# Patient Record
Sex: Female | Born: 1975 | Race: White | Hispanic: No | Marital: Married | Smoking: Never smoker
Health system: Southern US, Community
[De-identification: ages and names within clinical notes are randomized; demographics above are authoritative.]

## PROBLEM LIST (undated history)

## (undated) DIAGNOSIS — Z6791 Unspecified blood type, Rh negative: Secondary | ICD-10-CM

## (undated) DIAGNOSIS — K589 Irritable bowel syndrome without diarrhea: Secondary | ICD-10-CM

## (undated) DIAGNOSIS — F419 Anxiety disorder, unspecified: Secondary | ICD-10-CM

## (undated) DIAGNOSIS — B9689 Other specified bacterial agents as the cause of diseases classified elsewhere: Secondary | ICD-10-CM

## (undated) DIAGNOSIS — F329 Major depressive disorder, single episode, unspecified: Secondary | ICD-10-CM

## (undated) DIAGNOSIS — Z87440 Personal history of urinary (tract) infections: Secondary | ICD-10-CM

## (undated) DIAGNOSIS — A491 Streptococcal infection, unspecified site: Secondary | ICD-10-CM

## (undated) DIAGNOSIS — N760 Acute vaginitis: Secondary | ICD-10-CM

## (undated) DIAGNOSIS — F32A Depression, unspecified: Secondary | ICD-10-CM

## (undated) HISTORY — DX: Unspecified blood type, rh negative: Z67.91

## (undated) HISTORY — DX: Anxiety disorder, unspecified: F41.9

## (undated) HISTORY — DX: Streptococcal infection, unspecified site: A49.1

## (undated) HISTORY — DX: Depression, unspecified: F32.A

## (undated) HISTORY — DX: Acute vaginitis: N76.0

## (undated) HISTORY — DX: Acute vaginitis: B96.89

## (undated) HISTORY — DX: Major depressive disorder, single episode, unspecified: F32.9

## (undated) HISTORY — DX: Irritable bowel syndrome, unspecified: K58.9

## (undated) HISTORY — DX: Other specified bacterial agents as the cause of diseases classified elsewhere: B96.89

## (undated) HISTORY — PX: KNEE SURGERY: SHX244

## (undated) HISTORY — DX: Personal history of urinary (tract) infections: Z87.440

---

## 2001-05-17 HISTORY — PX: GANGLION CYST EXCISION: SHX1691

## 2002-09-05 HISTORY — PX: LUMBAR LAMINECTOMY: SHX95

## 2004-09-05 HISTORY — PX: BUNIONECTOMY: SHX129

## 2004-10-06 HISTORY — PX: BREAST LUMPECTOMY: SHX2

## 2005-09-05 HISTORY — PX: FOOT SURGERY: SHX648

## 2005-11-28 ENCOUNTER — Other Ambulatory Visit: Admission: RE | Admit: 2005-11-28 | Discharge: 2005-11-28 | Payer: Self-pay | Admitting: Obstetrics and Gynecology

## 2006-09-09 ENCOUNTER — Inpatient Hospital Stay (HOSPITAL_COMMUNITY): Admission: AD | Admit: 2006-09-09 | Discharge: 2006-09-09 | Payer: Self-pay | Admitting: Obstetrics and Gynecology

## 2006-10-20 ENCOUNTER — Inpatient Hospital Stay (HOSPITAL_COMMUNITY): Admission: AD | Admit: 2006-10-20 | Discharge: 2006-10-23 | Payer: Self-pay | Admitting: Obstetrics and Gynecology

## 2008-11-03 HISTORY — PX: BREAST SURGERY: SHX581

## 2010-11-05 ENCOUNTER — Emergency Department (HOSPITAL_COMMUNITY): Payer: Managed Care, Other (non HMO)

## 2010-11-05 ENCOUNTER — Emergency Department (HOSPITAL_COMMUNITY)
Admission: EM | Admit: 2010-11-05 | Discharge: 2010-11-06 | Disposition: A | Discharge status: Home / Self Care | Payer: Managed Care, Other (non HMO) | Attending: Emergency Medicine | Admitting: Emergency Medicine

## 2010-11-05 DIAGNOSIS — R0789 Other chest pain: Secondary | ICD-10-CM | POA: Insufficient documentation

## 2010-11-05 DIAGNOSIS — R0989 Other specified symptoms and signs involving the circulatory and respiratory systems: Secondary | ICD-10-CM | POA: Insufficient documentation

## 2010-11-05 DIAGNOSIS — I498 Other specified cardiac arrhythmias: Secondary | ICD-10-CM | POA: Insufficient documentation

## 2010-11-05 DIAGNOSIS — R0609 Other forms of dyspnea: Secondary | ICD-10-CM | POA: Insufficient documentation

## 2010-11-05 DIAGNOSIS — K589 Irritable bowel syndrome without diarrhea: Secondary | ICD-10-CM | POA: Insufficient documentation

## 2010-11-05 LAB — BASIC METABOLIC PANEL
Calcium: 9.5 mg/dL (ref 8.4–10.5)
Chloride: 96 mEq/L (ref 96–112)
Creatinine, Ser: 0.91 mg/dL (ref 0.4–1.2)
GFR calc Af Amer: >60 mL/min (ref >60)
Sodium: 135 mEq/L (ref 135–145)

## 2010-11-05 LAB — CBC
MCH: 29.9 pg (ref 26.0–34.0)
Platelets: 259 10*3/uL (ref 150–400)
RBC: 4.41 MIL/uL (ref 3.87–5.11)
WBC: 7.6 10*3/uL (ref 4.0–10.5)

## 2010-11-05 LAB — DIFFERENTIAL
Basophils Absolute: 0 10*3/uL (ref 0.0–0.1)
Basophils Relative: 0 % (ref 0–1)
Eosinophils Absolute: 0.1 10*3/uL (ref 0.0–0.7)
Monocytes Relative: 6 % (ref 3–12)
Neutrophils Relative %: 45 % (ref 43–77)

## 2010-11-05 LAB — POCT CARDIAC MARKERS
CKMB, poc: <1 ng/mL — ABNORMAL LOW (ref 1.0–8.0)
Myoglobin, poc: 36.4 ng/mL (ref 12–200)
Troponin i, poc: <0.05 ng/mL (ref 0.00–0.09)

## 2010-11-06 ENCOUNTER — Encounter (HOSPITAL_COMMUNITY): Payer: Self-pay | Admitting: Radiology

## 2010-11-06 ENCOUNTER — Emergency Department (HOSPITAL_COMMUNITY): Payer: Managed Care, Other (non HMO)

## 2010-11-06 MED ORDER — IOHEXOL 300 MG/ML  SOLN
100.0000 mL | Freq: Once | INTRAMUSCULAR | Status: AC | PRN
  Administered 2010-11-06: 100 mL via INTRAVENOUS

## 2011-10-24 ENCOUNTER — Other Ambulatory Visit: Payer: Self-pay | Admitting: Orthopedic Surgery

## 2011-10-24 DIAGNOSIS — M25552 Pain in left hip: Secondary | ICD-10-CM

## 2011-11-02 ENCOUNTER — Ambulatory Visit
Admission: RE | Admit: 2011-11-02 | Discharge: 2011-11-02 | Disposition: A | Discharge status: Home / Self Care | Payer: Managed Care, Other (non HMO) | Source: Ambulatory Visit | Attending: Orthopedic Surgery | Admitting: Orthopedic Surgery

## 2011-11-02 DIAGNOSIS — M25552 Pain in left hip: Secondary | ICD-10-CM

## 2011-11-02 DIAGNOSIS — M25551 Pain in right hip: Secondary | ICD-10-CM

## 2012-01-25 ENCOUNTER — Telehealth: Payer: Self-pay | Admitting: Obstetrics and Gynecology

## 2012-01-25 NOTE — Telephone Encounter (Signed)
Triage/cht received 

## 2012-01-25 NOTE — Telephone Encounter (Signed)
Spoke with pt c/o d/c and odor pt has appt 01/26/12 at 245 wit EP pt voice understanding

## 2012-01-26 ENCOUNTER — Encounter: Payer: Self-pay | Admitting: Obstetrics and Gynecology

## 2012-03-01 ENCOUNTER — Telehealth: Payer: Self-pay | Admitting: Obstetrics and Gynecology

## 2012-03-01 NOTE — Telephone Encounter (Signed)
medrec/done

## 2012-03-02 ENCOUNTER — Ambulatory Visit (INDEPENDENT_AMBULATORY_CARE_PROVIDER_SITE_OTHER): Payer: Commercial Indemnity | Admitting: Obstetrics and Gynecology

## 2012-03-02 ENCOUNTER — Encounter: Payer: Self-pay | Admitting: Obstetrics and Gynecology

## 2012-03-02 VITALS — BP 118/74 | Temp 98.3°F | Resp 16 | Ht 67.0 in | Wt 159.0 lb

## 2012-03-02 DIAGNOSIS — N949 Unspecified condition associated with female genital organs and menstrual cycle: Secondary | ICD-10-CM

## 2012-03-02 DIAGNOSIS — N898 Other specified noninflammatory disorders of vagina: Secondary | ICD-10-CM

## 2012-03-02 LAB — POCT WET PREP (WET MOUNT)
WBC, Wet Prep HPF POC: NEGATIVE
pH: 5

## 2012-03-02 LAB — POCT OSOM TRICHOMONAS RAPID TEST: Trichomonas vaginalis: NEGATIVE

## 2012-03-02 MED ORDER — METRONIDAZOLE 0.75 % VA GEL: 1.0000 | Freq: Every day | VAGINAL | Status: AC

## 2012-03-02 NOTE — Progress Notes (Signed)
C/O of fishy smell no discharge or irritation Abd soft, nt Normal hair distrubition mons pubis,  EGBUS WNL, sterile speculum exam,  vagina pink, moist normal rugae,  cerix LTC,strings of IUD visible no cervical motion tenderness, No adnexal masses or tenderness Wet prep positive clue, neg hyphae, neg trich A BV GC/CHL pending IUD from 11/2006. Plan removal and reinsertion of IUD f/o. Discussed needs to have back up contraception plans condoms. Lavera Guise, CNM

## 2012-03-03 LAB — GC/CHLAMYDIA PROBE AMP, GENITAL: GC Probe Amp, Genital: NEGATIVE

## 2012-03-06 ENCOUNTER — Encounter: Payer: Self-pay | Admitting: Obstetrics and Gynecology

## 2012-03-06 DIAGNOSIS — Z87440 Personal history of urinary (tract) infections: Secondary | ICD-10-CM

## 2012-03-06 DIAGNOSIS — B9689 Other specified bacterial agents as the cause of diseases classified elsewhere: Secondary | ICD-10-CM | POA: Insufficient documentation

## 2012-03-06 DIAGNOSIS — K589 Irritable bowel syndrome without diarrhea: Secondary | ICD-10-CM | POA: Insufficient documentation

## 2012-03-06 DIAGNOSIS — N760 Acute vaginitis: Secondary | ICD-10-CM

## 2012-03-06 DIAGNOSIS — B379 Candidiasis, unspecified: Secondary | ICD-10-CM

## 2012-03-06 DIAGNOSIS — Z6791 Unspecified blood type, Rh negative: Secondary | ICD-10-CM | POA: Insufficient documentation

## 2012-03-06 DIAGNOSIS — A491 Streptococcal infection, unspecified site: Secondary | ICD-10-CM | POA: Insufficient documentation

## 2012-03-06 DIAGNOSIS — N83209 Unspecified ovarian cyst, unspecified side: Secondary | ICD-10-CM | POA: Insufficient documentation

## 2012-07-12 ENCOUNTER — Encounter: Payer: Self-pay | Admitting: Obstetrics and Gynecology

## 2012-07-12 ENCOUNTER — Ambulatory Visit (INDEPENDENT_AMBULATORY_CARE_PROVIDER_SITE_OTHER): Payer: Commercial Indemnity | Admitting: Obstetrics and Gynecology

## 2012-07-12 VITALS — BP 100/60 | Temp 98.2°F | Wt 164.0 lb

## 2012-07-12 DIAGNOSIS — N390 Urinary tract infection, site not specified: Secondary | ICD-10-CM

## 2012-07-12 DIAGNOSIS — N898 Other specified noninflammatory disorders of vagina: Secondary | ICD-10-CM

## 2012-07-12 LAB — POCT URINALYSIS DIPSTICK
Protein, UA: NEGATIVE
Spec Grav, UA: 1.015
Urobilinogen, UA: NEGATIVE

## 2012-07-12 LAB — POCT WET PREP (WET MOUNT)
Trichomonas Wet Prep HPF POC: NEGATIVE
pH: 4.5

## 2012-07-12 MED ORDER — CEPHALEXIN 500 MG PO CAPS: 500.0000 mg | ORAL_CAPSULE | Freq: Four times a day (QID) | ORAL | Status: DC

## 2012-07-12 MED ORDER — FLUCONAZOLE 150 MG PO TABS: 150.0000 mg | ORAL_TABLET | Freq: Once | ORAL | Status: AC

## 2012-07-12 NOTE — Patient Instructions (Signed)
Warm soaks or compresses  No shaving until rash is resolved

## 2012-07-12 NOTE — Progress Notes (Signed)
36 YO with complaints of pubic "bumps" with the ones that are in panty line being tender.  Symptoms have been present for over a month with some relief from using Bactroban.  Also has over the past week has been having back pain at night and white vaginal discharge that is clumpy.  O: Pelvic: EGBUS- multiple papule/pustular lesions on vulva and crural folds, no adenopathy  Wet Prep: ph-4.5,  whiff-negative, no yeast, clue cells or trichomonas  U/A- SG:-1.15,   ph-6.0,  leukocytes-trace  A: Pubic Folliculitis  Cephalexin 500 mg  #28 qid x 28  no refill  Diflucan 150 mg #1  1 po stat 1 refill  Warm compresses or soaks  RTO-as scheduled or prn.  Zahrah Sutherlin, PA-C

## 2012-08-06 ENCOUNTER — Telehealth: Payer: Self-pay | Admitting: Obstetrics and Gynecology

## 2012-08-06 NOTE — Telephone Encounter (Signed)
Patient with pubic folliculits vs furunculosis managed with warm compresses/soaks and Cephalexin calls to request more antibiotics.  Patient needs to be scheduled to see an M.D. or referral to a dermatologist for management while she continues her soaks/warm compresses.  Should she develop a temperature > 100.4 degrees she should go to the ER or Urgent Care.  Travon Crochet, PA-C

## 2012-08-06 NOTE — Telephone Encounter (Signed)
EP, please see pt's message.

## 2012-08-07 NOTE — Telephone Encounter (Signed)
Tc to pt regarding pt msg.  Informed pt of EP's comments below.  Pt states would like to see a MD in the office for eval.  Pt scheduled an appt w/  [facility] tomorrow 08/08/12 @ 1600 for eval.

## 2012-08-08 ENCOUNTER — Encounter: Payer: Self-pay | Admitting: Obstetrics and Gynecology

## 2012-08-08 ENCOUNTER — Ambulatory Visit (INDEPENDENT_AMBULATORY_CARE_PROVIDER_SITE_OTHER): Payer: Commercial Indemnity | Admitting: Obstetrics and Gynecology

## 2012-08-08 VITALS — BP 100/58 | Ht 67.0 in | Wt 168.0 lb

## 2012-08-08 DIAGNOSIS — L738 Other specified follicular disorders: Secondary | ICD-10-CM

## 2012-08-08 DIAGNOSIS — L739 Follicular disorder, unspecified: Secondary | ICD-10-CM

## 2012-08-08 NOTE — Progress Notes (Signed)
GYN PROBLEM VISIT  Subjective: Ms. Allison Nelson is a 36 y.o. year old female,G2P2002, who presents for a problem visit. She has been treated for folliculitis with Keflex which she completed.  She did however, scratch the tops off some of the lesions on her buttocks, not because they were itching, but because she thought it would help her heal.  And bleed for about 15-20 mins.    Objective:  BP 100/58  Ht 5\' 7"  (1.702 m)  Wt 168 lb (76.204 kg)  BMI 26.31 kg/m2   1mm papules in various states of healing, Those on the right buttock have been unroofed and are erythematous    Assessment: Partially treated folliculitis with some healing  Plan: Culture  Warm soaks.  Dry with blow dryer Return to office prn if symptoms worsen or fail to improve.   Dierdre Forth, MD  08/08/2012 6:34 PM

## 2012-08-30 ENCOUNTER — Telehealth: Payer: Self-pay | Admitting: Obstetrics and Gynecology

## 2012-08-30 NOTE — Telephone Encounter (Signed)
Tc to pt per telephone call. Pt req ATB's.  Informed pt needs appt for eval. Appt sched 08/31/12 @ 3:15p with EP for eval. Pt agrees.

## 2012-08-30 NOTE — Telephone Encounter (Signed)
vph pt 

## 2012-08-31 ENCOUNTER — Ambulatory Visit (INDEPENDENT_AMBULATORY_CARE_PROVIDER_SITE_OTHER): Payer: Commercial Indemnity | Admitting: Obstetrics and Gynecology

## 2012-08-31 ENCOUNTER — Encounter: Payer: Self-pay | Admitting: Obstetrics and Gynecology

## 2012-08-31 VITALS — BP 106/62 | Ht 67.0 in | Wt 170.0 lb

## 2012-08-31 DIAGNOSIS — R21 Rash and other nonspecific skin eruption: Secondary | ICD-10-CM

## 2012-08-31 DIAGNOSIS — Z30432 Encounter for removal of intrauterine contraceptive device: Secondary | ICD-10-CM

## 2012-08-31 MED ORDER — BETAMETHASONE DIPROPIONATE AUG 0.05 % EX CREA: TOPICAL_CREAM | Freq: Two times a day (BID) | CUTANEOUS | Status: DC

## 2012-08-31 MED ORDER — AMOXICILLIN-POT CLAVULANATE 875-125 MG PO TABS: 1.0000 | ORAL_TABLET | Freq: Two times a day (BID) | ORAL | Status: DC

## 2012-08-31 MED ORDER — FLUCONAZOLE 150 MG PO TABS: 150.0000 mg | ORAL_TABLET | Freq: Once | ORAL | Status: AC

## 2012-08-31 NOTE — Progress Notes (Signed)
36 YO complains of folliculitis in the genital area (flare).  Has been doing sitz baths and antibiotics two months ago but symptoms have not really gone away.  States that areas bleed when she scratches those areas.  Lastly wants to try to have another baby so would like to have her IUD removed.    O: Pelvic:  EGBUS- diffuse unroofed ? pin-point sized, raised lesions, mildly tender with no inguinal adenopathy; vagina-copious mucoid white discharge, cervix      IUD removed without difficulty per protocol, uterus/adnexae-normal   A:  IUD Removal      Perineal Rash      Desire to conceive  P: HSV culture pending       Warm soaks        Diprolene AF  0.05%  15 grams apply to affected areas bid x 7-14 days only no refills        Patient would like to try Augmentin as it was the antibiotic that resolved her husband's symptoms       of the same thing        Augmentin 875  #14 bid x 7 days        Diflucan 150 mg #1 1 po stat 1 refill                    RTO-as scheduled or prn       Refer to Dermatologist  Tarra Pence, PA-C

## 2012-09-03 LAB — HERPES SIMPLEX VIRUS CULTURE: Organism ID, Bacteria: NOT DETECTED

## 2012-09-19 ENCOUNTER — Encounter: Payer: Commercial Indemnity | Admitting: Obstetrics and Gynecology

## 2012-10-08 ENCOUNTER — Ambulatory Visit: Payer: Commercial Indemnity | Admitting: Obstetrics and Gynecology

## 2012-10-08 ENCOUNTER — Encounter: Payer: Self-pay | Admitting: Obstetrics and Gynecology

## 2012-10-08 VITALS — BP 112/72 | Resp 14 | Ht 67.0 in | Wt 166.0 lb

## 2012-10-08 DIAGNOSIS — Z124 Encounter for screening for malignant neoplasm of cervix: Secondary | ICD-10-CM

## 2012-10-08 DIAGNOSIS — Z01419 Encounter for gynecological examination (general) (routine) without abnormal findings: Secondary | ICD-10-CM

## 2012-10-08 NOTE — Progress Notes (Signed)
Patient ID: Allison Nelson, female   DOB: 1975-12-04, 37 y.o.   MRN: [id number] Contraception None. Trying to conceive Last pap 03/2011 wnl Last Mammo 09/2012 Last Colonoscopy 10-12 years ago  Last Dexa Scan never Primary MD Dr. [place], [place] Abuse at Home none  Just recently started trying to conceive.  Filed Vitals:   10/08/12 1002  BP: 112/72  Resp: 14   ROS: noncontributory  Physical Examination: General appearance - alert, well appearing, and in no distress Neck - supple, no significant adenopathy Chest - clear to auscultation, no wheezes, rales or rhonchi, symmetric air entry Heart - normal rate and regular rhythm Abdomen - soft, nontender, nondistended, no masses or organomegaly Breasts - breasts appear normal, no suspicious masses, no skin or nipple changes or axillary nodes Pelvic - normal external genitalia, vulva, vagina, cervix, uterus and adnexa Back exam - no CVAT Extremities - no edema, redness or tenderness in the calves or thighs  A/P Pap today Refer to [facility] if no success with conception after , pt will call [facility] in 12yr

## 2012-10-09 LAB — PAP IG W/ RFLX HPV ASCU

## 2013-01-30 LAB — OB RESULTS CONSOLE RUBELLA ANTIBODY, IGM: Rubella: IMMUNE

## 2013-01-30 LAB — OB RESULTS CONSOLE GC/CHLAMYDIA
CHLAMYDIA, DNA PROBE: NEGATIVE
GC PROBE AMP, GENITAL: NEGATIVE

## 2013-01-30 LAB — OB RESULTS CONSOLE HIV ANTIBODY (ROUTINE TESTING): HIV: NONREACTIVE

## 2013-01-30 LAB — OB RESULTS CONSOLE HEPATITIS B SURFACE ANTIGEN: Hepatitis B Surface Ag: NEGATIVE

## 2013-02-05 LAB — OB RESULTS CONSOLE ABO/RH: RH TYPE: NEGATIVE

## 2013-05-19 ENCOUNTER — Emergency Department (HOSPITAL_COMMUNITY)
Admission: EM | Admit: 2013-05-19 | Discharge: 2013-05-19 | Disposition: A | Discharge status: Home / Self Care | Payer: Managed Care, Other (non HMO) | Attending: Emergency Medicine | Admitting: Emergency Medicine

## 2013-05-19 ENCOUNTER — Encounter (HOSPITAL_COMMUNITY): Payer: Self-pay | Admitting: Emergency Medicine

## 2013-05-19 DIAGNOSIS — O99340 Other mental disorders complicating pregnancy, unspecified trimester: Secondary | ICD-10-CM | POA: Insufficient documentation

## 2013-05-19 DIAGNOSIS — R51 Headache: Secondary | ICD-10-CM | POA: Insufficient documentation

## 2013-05-19 DIAGNOSIS — F329 Major depressive disorder, single episode, unspecified: Secondary | ICD-10-CM | POA: Insufficient documentation

## 2013-05-19 DIAGNOSIS — F3289 Other specified depressive episodes: Secondary | ICD-10-CM | POA: Insufficient documentation

## 2013-05-19 DIAGNOSIS — Z79899 Other long term (current) drug therapy: Secondary | ICD-10-CM | POA: Insufficient documentation

## 2013-05-19 DIAGNOSIS — O9989 Other specified diseases and conditions complicating pregnancy, childbirth and the puerperium: Secondary | ICD-10-CM | POA: Insufficient documentation

## 2013-05-19 DIAGNOSIS — Z8619 Personal history of other infectious and parasitic diseases: Secondary | ICD-10-CM | POA: Insufficient documentation

## 2013-05-19 DIAGNOSIS — F411 Generalized anxiety disorder: Secondary | ICD-10-CM | POA: Insufficient documentation

## 2013-05-19 DIAGNOSIS — Z87440 Personal history of urinary (tract) infections: Secondary | ICD-10-CM | POA: Insufficient documentation

## 2013-05-19 DIAGNOSIS — K589 Irritable bowel syndrome without diarrhea: Secondary | ICD-10-CM | POA: Insufficient documentation

## 2013-05-19 DIAGNOSIS — K429 Umbilical hernia without obstruction or gangrene: Secondary | ICD-10-CM

## 2013-05-19 NOTE — ED Notes (Signed)
C/o umbilical hernia that she has had for years "turning blue" and increased pain since Thursday.  Denies pain at hernia at this time.  Also c/o headache since Wednesday.  Pt is [redacted] weeks pregnant and reports frequent headaches with this pregnancy.

## 2013-05-19 NOTE — ED Provider Notes (Signed)
CSN: [id number]     Arrival date & time 05/19/13  1840 History   First MD Initiated Contact with Patient 05/19/13 1901     Chief Complaint  Patient presents with  . Hernia  . Headache   (Consider location/radiation/quality/duration/timing/severity/associated sxs/prior Treatment) Patient is a 37 y.o. female presenting with abdominal pain. The history is provided by the patient. No language interpreter was used.  Abdominal Pain Pain location: umbilical. Pain quality: aching and cramping   Pain radiates to:  Does not radiate Pain severity:  Mild Onset quality:  Gradual Duration:  3 days Timing:  Intermittent Progression:  Waxing and waning Chronicity:  New Context: not previous surgeries and not trauma   Context comment:  Umbilical hernia Relieved by:  Nothing Worsened by:  Nothing tried Ineffective treatments:  None tried Associated symptoms: no chest pain, no cough, no diarrhea, no dysuria, no fever, no hematuria, no nausea, no shortness of breath, no sore throat and no vomiting   Risk factors: pregnancy   Risk factors: no recent hospitalization     Past Medical History  Diagnosis Date  . IBS (irritable bowel syndrome)   . Anxiety   . Depression   . BV (bacterial vaginosis)   . Blood type, Rh negative   . GBS (group B streptococcus) infection   . Hx: UTI (urinary tract infection)    Past Surgical History  Procedure Laterality Date  . Breast surgery  11/2008    Breast Implants  . Bunionectomy  09/2004    right bunion  . Lumbar laminectomy  09/2002  . Ganglion cyst excision  05/17/2001    Right wrist  . Knee surgery      Torn meniscus  . Breast lumpectomy  10/2004  . Foot surgery  09/2005    remove pins from previous surgery   Family History  Problem Relation Age of Onset  . Cancer Paternal Grandfather     LUNG  . Heart disease Paternal Grandmother   . Hyperlipidemia Father    History  Substance Use Topics  . Smoking status: Never Smoker   . Smokeless tobacco:  Never Used  . Alcohol Use: Yes     Comment: Wine & beer on weekends   OB History   Grav Para Term Preterm Abortions TAB SAB Ect Mult Living   3 2 2       2      Review of Systems  Constitutional: Negative for fever.  HENT: Negative for congestion, sore throat and rhinorrhea.   Respiratory: Negative for cough and shortness of breath.   Cardiovascular: Negative for chest pain.  Gastrointestinal: Negative for nausea, vomiting, abdominal pain and diarrhea.  Genitourinary: Negative for dysuria and hematuria.  Skin: Negative for rash.  Neurological: Negative for syncope, light-headedness and headaches.  All other systems reviewed and are negative.    Allergies  Review of patient's allergies indicates no known allergies.  Home Medications   Current Outpatient Rx  Name  Route  Sig  Dispense  Refill  . amoxicillin-clavulanate (AUGMENTIN) 875-125 MG per tablet   Oral   Take 1 tablet by mouth 2 (two) times daily.   14 tablet   0   . augmented betamethasone dipropionate (DIPROLENE AF) 0.05 % cream   Topical   Apply topically 2 (two) times daily. x 7-14 days   15 g   0   . cephALEXin (KEFLEX) 500 MG capsule   Oral   Take 1 capsule (500 mg total) by mouth 4 (four) times daily.  28 capsule   0   . cetirizine (ZYRTEC) 10 MG tablet   Oral   Take 10 mg by mouth daily.         Marland Kitchen docusate sodium (COLACE) 100 MG capsule   Oral   Take 100 mg by mouth 2 (two) times daily.         Marland Kitchen escitalopram (LEXAPRO) 20 MG tablet   Oral   Take 20 mg by mouth daily.         Marland Kitchen gabapentin (NEURONTIN) 300 MG capsule   Oral   Take 300 mg by mouth 3 (three) times daily.         Marland Kitchen glucosamine-chondroitin 500-400 MG tablet   Oral   Take 1 tablet by mouth once.         Marland Kitchen HYDROcodone-acetaminophen (NORCO) 5-325 MG per tablet   Oral   Take 1 tablet by mouth every 6 (six) hours as needed.         . Lactobacillus-Inulin (CULTURELLE) CAPS   Oral   Take by mouth 1 day or 1 dose.          . lubiprostone (AMITIZA) 24 MCG capsule   Oral   Take 24 mcg by mouth 2 (two) times daily with a meal.         . omeprazole (PRILOSEC) 40 MG capsule   Oral   Take 40 mg by mouth daily.         Marland Kitchen senna (SENOKOT) 8.6 MG TABS   Oral   Take 1 tablet by mouth.         . zolmitriptan (ZOMIG) 5 MG tablet   Oral   Take 5 mg by mouth as needed.          BP 144/83  Pulse 85  Temp(Src) 98.3 F (36.8 C) (Oral)  Resp 16  SpO2 100%  LMP 09/13/2012 Physical Exam  Nursing note and vitals reviewed. Constitutional: She is oriented to person, place, and time. She appears well-developed and well-nourished.  HENT:  Head: Normocephalic and atraumatic.  Right Ear: External ear normal.  Left Ear: External ear normal.  Eyes: EOM are normal.  Neck: Normal range of motion. Neck supple.  Cardiovascular: Normal rate, regular rhythm and intact distal pulses.  Exam reveals no gallop and no friction rub.   No murmur heard. Pulmonary/Chest: Effort normal and breath sounds normal. No respiratory distress. She has no wheezes. She has no rales. She exhibits no tenderness.  Abdominal: Soft. Bowel sounds are normal. She exhibits no distension. There is no tenderness. There is no rebound.  Small reducible periumbilical hernia  Musculoskeletal: Normal range of motion. She exhibits no edema and no tenderness.  Lymphadenopathy:    She has no cervical adenopathy.  Neurological: She is alert and oriented to person, place, and time.  Skin: Skin is warm. No rash noted.  Psychiatric: She has a normal mood and affect. Her behavior is normal.    ED Course  Procedures (including critical care time) Labs Review Labs Reviewed - No data to display Imaging Review No results found.  MDM   1. Umbilical hernia   2. Headache    7:05 PM Pt is a 37 y.o. female with pertinent PMHX of G3P2002 who presents to the ED with periumbilical hernia that turned blue with occasional abdominal pain and Headache.  Pt has history of chronic trigger point headaches that she receives injections for. Pt stated her headache is similar to previous, no thunderclap. No blurred or double  vision. Pt took a vicodin with some relief and does not wish to have anything else for it. Pt comes in for concern of her belly button turning blue. Pt noted an umbilical hernia that is worsening. Pt occasionally get's pain. Pt felt something "crunchy" the other day but was able to push it back in. No fevers, no nausea, vomiting or diarrhea. No syncope. Pt stated she is [redacted] weeks pregnant. Denies vaginal bleeding or discharge. Deneis dysuria.  Study:  Limited ultrasound of the abdomen/umbilicus  Indications:  Possible umbilical hernia  Performed and interpreted by myself at the bedside.  Images are not archived. Printed images  Findings include: umbilical hernia  Study limited by:NA  Interpretation:  No evidence of incarceration  Comments: small umbilical hernia without evidence of incarceration of bowel or omentum  CPT:  Abdominal wall [phone]  A brief and limited bedside US was performed to determine the FHR as nursing was unable to obtain it. The FHR is 150 bpm. No other gross abnormalities are noted. The study was performed and interpreted by me and images are archived per our departmental policy.  No incarceration, reducible. Plan for discharge. Reassuring fetal heart rate. Fetus noted to move during US exam. Pt given precauitons for inability to reduce.Pt to follow up as needed with OB. Pt stated she has plenty of medication at home for her headache and declines treatment for her headache at this time.  8:04 PM: I have discussed the diagnosis/risks/treatment options with the patient and believe the pt to be eligible for discharge home to follow-up with OBGYN. We also discussed returning to the ED immediately if new or worsening sx occur. We discussed the sx which are most concerning (e.g., unable to reduce hernia) that  necessitate immediate return. Any new prescriptions provided to the patient are listed below.   New Prescriptions   No medications on file    The patient appears reasonably screened and/or stabilized for discharge and I doubt any other medical condition or other [facility] requiring further screening, evaluation or treatment in the ED at this time prior to discharge . Pt in agreement with discharge plan. Return precautions given. Pt discharged VSS  Pt was discussed with my attending, Dr. Marlene Bast, MD 05/19/13 [phone]

## 2013-05-29 NOTE — ED Provider Notes (Deleted)
Medical screening examination/treatment/procedure(s) were performed by non-physician practitioner and as supervising physician I was immediately available for consultation/collaboration.   Roney Marion, MD 05/29/13 1059

## 2013-05-30 NOTE — ED Provider Notes (Signed)
I saw and evaluated the patient, reviewed the resident's note and I agree with the findings and plan  Pt abdominal exam without peritoneal irritation.  Pt states minimal discomfort at this time. Small reducible periumbilical hernia without cyanosis or pain/tenderness.  Bedside [place] performed by resident reviewed.  Obvious viable intrauterine pregnancy.   Roney Marion, MD 05/30/13 1105

## 2013-06-26 DIAGNOSIS — Z8742 Personal history of other diseases of the female genital tract: Secondary | ICD-10-CM | POA: Insufficient documentation

## 2013-06-26 DIAGNOSIS — F3289 Other specified depressive episodes: Secondary | ICD-10-CM | POA: Insufficient documentation

## 2013-06-26 DIAGNOSIS — R51 Headache: Secondary | ICD-10-CM | POA: Insufficient documentation

## 2013-06-26 DIAGNOSIS — Z79899 Other long term (current) drug therapy: Secondary | ICD-10-CM | POA: Insufficient documentation

## 2013-06-26 DIAGNOSIS — F411 Generalized anxiety disorder: Secondary | ICD-10-CM | POA: Insufficient documentation

## 2013-06-26 DIAGNOSIS — Z8619 Personal history of other infectious and parasitic diseases: Secondary | ICD-10-CM | POA: Insufficient documentation

## 2013-06-26 DIAGNOSIS — O9989 Other specified diseases and conditions complicating pregnancy, childbirth and the puerperium: Secondary | ICD-10-CM | POA: Insufficient documentation

## 2013-06-26 DIAGNOSIS — Z87440 Personal history of urinary (tract) infections: Secondary | ICD-10-CM | POA: Insufficient documentation

## 2013-06-26 DIAGNOSIS — Z8719 Personal history of other diseases of the digestive system: Secondary | ICD-10-CM | POA: Insufficient documentation

## 2013-06-26 DIAGNOSIS — F329 Major depressive disorder, single episode, unspecified: Secondary | ICD-10-CM | POA: Insufficient documentation

## 2013-06-27 ENCOUNTER — Emergency Department (HOSPITAL_COMMUNITY)
Admission: EM | Admit: 2013-06-27 | Discharge: 2013-06-27 | Disposition: A | Discharge status: Home / Self Care | Payer: Managed Care, Other (non HMO) | Attending: Emergency Medicine | Admitting: Emergency Medicine

## 2013-06-27 ENCOUNTER — Emergency Department (HOSPITAL_COMMUNITY): Payer: Managed Care, Other (non HMO)

## 2013-06-27 ENCOUNTER — Encounter (HOSPITAL_COMMUNITY): Payer: Self-pay | Admitting: Emergency Medicine

## 2013-06-27 DIAGNOSIS — Z3490 Encounter for supervision of normal pregnancy, unspecified, unspecified trimester: Secondary | ICD-10-CM

## 2013-06-27 LAB — CSF CELL COUNT WITH DIFFERENTIAL
RBC Count, CSF: 17 /mm3 — ABNORMAL HIGH
Tube #: 1
WBC, CSF: 0 /mm3 (ref 0–5)
WBC, CSF: 0 /mm3 (ref 0–5)

## 2013-06-27 LAB — GRAM STAIN

## 2013-06-27 LAB — PROTEIN, CSF: Total  Protein, CSF: 16 mg/dL (ref 15–45)

## 2013-06-27 MED ORDER — HYDROMORPHONE HCL PF 1 MG/ML IJ SOLN
1.0000 mg | Freq: Once | INTRAMUSCULAR | Status: AC
  Administered 2013-06-27: 1 mg via INTRAVENOUS
  Filled 2013-06-27: qty 1

## 2013-06-27 MED ORDER — METOCLOPRAMIDE HCL 5 MG/ML IJ SOLN
10.0000 mg | Freq: Once | INTRAMUSCULAR | Status: AC
  Administered 2013-06-27: 10 mg via INTRAVENOUS
  Filled 2013-06-27: qty 2

## 2013-06-27 MED ORDER — HYDROMORPHONE HCL 2 MG PO TABS: 2.0000 mg | ORAL_TABLET | ORAL | Status: DC | PRN

## 2013-06-27 MED ORDER — DEXAMETHASONE SODIUM PHOSPHATE 10 MG/ML IJ SOLN
10.0000 mg | Freq: Once | INTRAMUSCULAR | Status: AC
  Administered 2013-06-27: 10 mg via INTRAVENOUS
  Filled 2013-06-27: qty 1

## 2013-06-27 MED ORDER — SODIUM CHLORIDE 0.9 % IV SOLN
1000.0000 mL | Freq: Once | INTRAVENOUS | Status: AC
  Administered 2013-06-27: 1000 mL via INTRAVENOUS

## 2013-06-27 MED ORDER — DIPHENHYDRAMINE HCL 50 MG/ML IJ SOLN
25.0000 mg | Freq: Once | INTRAMUSCULAR | Status: AC
Start: 2013-06-27 — End: 2013-06-27
  Administered 2013-06-27: 25 mg via INTRAVENOUS
  Filled 2013-06-27: qty 1

## 2013-06-27 MED ORDER — SODIUM CHLORIDE 0.9 % IV SOLN
1000.0000 mL | INTRAVENOUS | Status: DC
  Administered 2013-06-27: 1000 mL via INTRAVENOUS

## 2013-06-27 NOTE — ED Notes (Signed)
Pt states the pain is getting worse.

## 2013-06-27 NOTE — ED Notes (Signed)
Presents with headache that began this am, began with neck pain and has progressed to migraine associated with light and sound sensitivity, slight nausea. pain described as pounding. Worse behind eye. Pt is [redacted] weeks pregnant, FHT 143, bp 134/76.  Alert and oriented. Answers all questions appropriately.

## 2013-06-27 NOTE — ED Provider Notes (Signed)
CSN: [id number]     Arrival date & time 06/26/13  2359 History   First MD Initiated Contact with Patient 06/27/13 [id number]     Chief Complaint  Patient presents with  . Headache   (Consider location/radiation/quality/duration/timing/severity/associated sxs/prior Treatment) Patient is a 37 y.o. female presenting with headaches. The history is provided by the patient.  Headache She is [redacted] weeks pregnant and comes in with headache for the last 4 days. Headache is starting in the superior part of the neck and radiates to the top of head or head into the frontal area. She describes it as a throbbing sensation. It got much worse this afternoon. She had been taking hydrocodone 10 mg which had been keeping the headache under control until today. There is mild photophobia and phonophobia. She denies nausea vomiting and she denies visual change. She denies weakness, numbness, tingling. Denies fever or chills. There has been normal fetal movement. Headache is similar to migraine headaches she has had in the past. There have been no complications from her pregnancy other than narcotic use further pinched nerve in her neck.  Past Medical History  Diagnosis Date  . IBS (irritable bowel syndrome)   . Anxiety   . Depression   . BV (bacterial vaginosis)   . Blood type, Rh negative   . GBS (group B streptococcus) infection   . Hx: UTI (urinary tract infection)    Past Surgical History  Procedure Laterality Date  . Breast surgery  11/2008    Breast Implants  . Bunionectomy  09/2004    right bunion  . Lumbar laminectomy  09/2002  . Ganglion cyst excision  05/17/2001    Right wrist  . Knee surgery      Torn meniscus  . Breast lumpectomy  10/2004  . Foot surgery  09/2005    remove pins from previous surgery   Family History  Problem Relation Age of Onset  . Cancer Paternal Grandfather     LUNG  . Heart disease Paternal Grandmother   . Hyperlipidemia Father    History  Substance Use Topics  . Smoking  status: Never Smoker   . Smokeless tobacco: Never Used  . Alcohol Use: Yes     Comment: Wine & beer on weekends   OB History   Grav Para Term Preterm Abortions TAB SAB Ect Mult Living   3 2 2       2      Review of Systems  Neurological: Positive for headaches.  All other systems reviewed and are negative.    Allergies  Review of patient's allergies indicates no known allergies.  Home Medications   Current Outpatient Rx  Name  Route  Sig  Dispense  Refill  . cetirizine (ZYRTEC) 10 MG tablet   Oral   Take 10 mg by mouth daily.         Marland Kitchen docusate sodium (COLACE) 100 MG capsule   Oral   Take 100 mg by mouth 2 (two) times daily.         Marland Kitchen escitalopram (LEXAPRO) 20 MG tablet   Oral   Take 30 mg by mouth daily.          Marland Kitchen HYDROcodone-acetaminophen (NORCO) 10-325 MG per tablet   Oral   Take 1 tablet by mouth every 6 (six) hours as needed for pain (pain).         . Lactobacillus-Inulin (CULTURELLE) CAPS   Oral   Take by mouth 1 day or 1 dose.         Marland Kitchen  prenatal vitamin w/FE, FA (PRENATAL 1 + 1) 27-1 MG TABS tablet   Oral   Take 1 tablet by mouth daily at 12 noon.         . senna (SENOKOT) 8.6 MG TABS   Oral   Take 1 tablet by mouth.          BP 134/76  Pulse 86  Temp(Src) 98.8 F (37.1 C) (Oral)  Resp 16  SpO2 96%  LMP 09/13/2012 Physical Exam  Nursing note and vitals reviewed.  37 year old female, resting comfortably and in no acute distress. Vital signs are normal. Oxygen saturation is 96%, which is normal. Head is normocephalic and atraumatic. PERRLA, EOMI. Oropharynx is clear. Fundi show no hemorrhage, exudate, or papilledema. Neck is nontender and supple without adenopathy or JVD. There is mild tenderness to palpation at the insertion of the right paracervical muscles. Back is nontender and there is no CVA tenderness. Lungs are clear without rales, wheezes, or rhonchi. Chest is nontender. Heart has regular rate and rhythm without  murmur. Abdomen is soft, flat, nontender without masses or hepatosplenomegaly and peristalsis is normoactive. Gravid uterus is present and nontender. Size is consistent with her dates. Extremities have no cyanosis or edema, full range of motion is present. Skin is warm and dry without rash. Neurologic: Mental status is normal, cranial nerves are intact, there are no motor or sensory deficits.  ED Course  LUMBAR PUNCTURE Date/Time: 06/27/2013 4:30 AM Performed by: Dione Booze Authorized by: Preston Fleeting, Leveda Kendrix Consent: Verbal consent obtained. written consent obtained. Risks and benefits: risks, benefits and alternatives were discussed Consent given by: patient Patient understanding: patient states understanding of the procedure being performed Patient consent: the patient's understanding of the procedure matches consent given Procedure consent: procedure consent matches procedure scheduled Relevant documents: relevant documents present and verified Site marked: the operative site was marked Imaging studies: imaging studies available Required items: required blood products, implants, devices, and special equipment available Patient identity confirmed: verbally with patient and arm band Time out: Immediately prior to procedure a "time out" was called to verify the correct patient, procedure, equipment, support staff and site/side marked as required. Indications: evaluation for subarachnoid hemorrhage Anesthesia: local infiltration Local anesthetic: lidocaine 1% without epinephrine Anesthetic total: 1 ml Patient sedated: no Preparation: Patient was prepped and draped in the usual sterile fashion. Lumbar space: L3-L4 interspace Patient's position: left lateral decubitus Needle gauge: 22 Needle type: spinal needle - Quincke tip Needle length: 3.5 in Number of attempts: 1 Opening pressure: 18 cm H2O Fluid appearance: clear Tubes of fluid: 4 Total volume: 4 ml Post-procedure: site cleaned  and adhesive bandage applied Patient tolerance: Patient tolerated the procedure well with no immediate complications.   (including critical care time) Labs Review Results for orders placed during the hospital encounter of 06/27/13  GRAM STAIN      Result Value Range   Specimen Description CSF     Special Requests NONE     Gram Stain       Value: CYTOSPIN     NO WBC SEEN     NO ORGANISMS SEEN   Report Status 06/27/2013 FINAL    CSF CELL COUNT WITH DIFFERENTIAL      Result Value Range   Tube # 1     Color, CSF COLORLESS  COLORLESS   Appearance, CSF CLEAR (*) CLEAR   Supernatant NOT INDICATED     RBC Count, CSF 17 (*) 0 /cu mm   WBC, CSF 0  0 -  5 /cu mm   Segmented Neutrophils-CSF PENDING  0 - 6 %   Lymphs, CSF PENDING  40 - 80 %   Monocyte-Macrophage-Spinal Fluid PENDING  15 - 45 %   Eosinophils, CSF PENDING  0 - 1 %   Other Cells, CSF PENDING    CSF CELL COUNT WITH DIFFERENTIAL      Result Value Range   Tube # 4     Color, CSF COLORLESS  COLORLESS   Appearance, CSF CLEAR (*) CLEAR   Supernatant NOT INDICATED     RBC Count, CSF 0  0 /cu mm   WBC, CSF 0  0 - 5 /cu mm   Segmented Neutrophils-CSF PENDING  0 - 6 %   Lymphs, CSF PENDING  40 - 80 %   Monocyte-Macrophage-Spinal Fluid PENDING  15 - 45 %   Eosinophils, CSF PENDING  0 - 1 %   Other Cells, CSF PENDING    GLUCOSE, CSF      Result Value Range   Glucose, CSF 62  43 - 76 mg/dL  PROTEIN, CSF      Result Value Range   Total  Protein, CSF 16  15 - 45 mg/dL   Ct Head Wo Contrast  06/27/2013   CLINICAL DATA:  Headache. Onset on Monday but more severe this evening. Headache is worse with light and sound. Patient is pregnant and was shielded for the examination.  EXAM: CT HEAD WITHOUT CONTRAST  TECHNIQUE: Contiguous axial images were obtained from the base of the skull through the vertex without intravenous contrast.  COMPARISON:  None.  FINDINGS: Ventricles and sulci appear symmetrical. No mass effect or midline shift. No  abnormal extra-axial fluid collections. Gray-white matter junctions are distinct. Basal cisterns are not effaced. No evidence of acute intracranial hemorrhage. No depressed skull fractures. Visualized paranasal sinuses and mastoid air cells are not opacified.  IMPRESSION: No acute intracranial abnormalities.   Electronically Signed   By: Burman Nieves M.D.   On: 06/27/2013 02:22    Imaging Review Ct Head Wo Contrast  06/27/2013   CLINICAL DATA:  Headache. Onset on Monday but more severe this evening. Headache is worse with light and sound. Patient is pregnant and was shielded for the examination.  EXAM: CT HEAD WITHOUT CONTRAST  TECHNIQUE: Contiguous axial images were obtained from the base of the skull through the vertex without intravenous contrast.  COMPARISON:  None.  FINDINGS: Ventricles and sulci appear symmetrical. No mass effect or midline shift. No abnormal extra-axial fluid collections. Gray-white matter junctions are distinct. Basal cisterns are not effaced. No evidence of acute intracranial hemorrhage. No depressed skull fractures. Visualized paranasal sinuses and mastoid air cells are not opacified.  IMPRESSION: No acute intracranial abnormalities.   Electronically Signed   By: Burman Nieves M.D.   On: 06/27/2013 02:22   MDM   1. Headache   2. Pregnancy    Headache which may be a migraine variant. Third trimester pregnancy. She'll be given IV fluids, IV metoclopramide, IV diphenhydramine and reassessed. Old records are reviewed and I do not see prior ED visits for migraine headaches.  Patient did not get any relief with the above-noted treatment. She was sent for CT of the head which has come back negative. She is given hydromorphone for her pain which gave her moderate relief. He patient was advised of need for lumbar puncture to rule out subarachnoid hemorrhage. Patient expressed understanding and lumbar puncture was done with normal results. She is given a dose  of dexamethasone  in the ED. She is discharged with a prescription for hydromorphone. I have explained to her the risk of fetal addiction. She has a neurologist who she sees for her back and she is to ask him if he can take care of her for her headaches. I am hoping to minimize the amount of narcotics she has to take. Patient expresses understanding.  Dione Booze, MD 06/27/13 [phone]

## 2013-06-27 NOTE — ED Notes (Signed)
Pt to ED for evaluation of a headache onset on Monday that has been more severe this evening.  Pt has been taking Hydrocodone 10mg  since Monday which has been working for the pain but tonight when she woke up from a nap the "pain was unbearable".  Admits to headache being worse with light and sound- denies N/V.  OB rapid response nurse at bedside.

## 2013-06-30 LAB — CSF CULTURE W GRAM STAIN
Culture: NO GROWTH
Gram Stain: NONE SEEN

## 2013-06-30 LAB — CSF CULTURE

## 2013-08-14 ENCOUNTER — Other Ambulatory Visit: Payer: Self-pay | Admitting: Specialist

## 2013-08-14 DIAGNOSIS — M542 Cervicalgia: Secondary | ICD-10-CM

## 2013-08-14 DIAGNOSIS — M5416 Radiculopathy, lumbar region: Secondary | ICD-10-CM

## 2013-08-15 ENCOUNTER — Other Ambulatory Visit: Payer: Managed Care, Other (non HMO)

## 2013-08-22 ENCOUNTER — Ambulatory Visit
Admission: RE | Admit: 2013-08-22 | Discharge: 2013-08-22 | Disposition: A | Discharge status: Home / Self Care | Payer: Managed Care, Other (non HMO) | Source: Ambulatory Visit | Attending: Specialist | Admitting: Specialist

## 2013-08-22 DIAGNOSIS — M5416 Radiculopathy, lumbar region: Secondary | ICD-10-CM

## 2013-08-22 DIAGNOSIS — M542 Cervicalgia: Secondary | ICD-10-CM

## 2013-09-04 LAB — OB RESULTS CONSOLE GBS: GBS: NEGATIVE

## 2013-09-05 NOTE — L&D Delivery Note (Signed)
Delivery Note At 1:08 PM a viable female was delivered via Vaginal, Spontaneous Delivery (Presentation: Left Occiput Anterior).  APGAR: 9, 9; weight pending.   Placenta status: Intact, Spontaneous, succenturiata lobe noted attached after delivery of placenta.  Cord: 3 vessels with the following complications: none.  Cord pH: not collected  Anesthesia:  Epidural Episiotomy: None Lacerations: 1st degree Perineal Suture Repair: 3.0 vicryl Est. Blood Loss (mL): 300  Mom to postpartum.  Baby to Couplet care / Skin to Skin.  Routine PP orders Breastfeeding Inpatient circ  Allison Nelson, Allison Nelson 09/29/2013, 1:51 PM

## 2013-09-25 ENCOUNTER — Encounter (HOSPITAL_COMMUNITY): Payer: Self-pay | Admitting: *Deleted

## 2013-09-25 ENCOUNTER — Inpatient Hospital Stay (HOSPITAL_COMMUNITY)
Admission: AD | Admit: 2013-09-25 | Discharge: 2013-09-26 | Disposition: A | Discharge status: Home / Self Care | Payer: Managed Care, Other (non HMO) | Source: Ambulatory Visit | Attending: Obstetrics and Gynecology | Admitting: Obstetrics and Gynecology

## 2013-09-25 DIAGNOSIS — O479 False labor, unspecified: Secondary | ICD-10-CM | POA: Insufficient documentation

## 2013-09-25 DIAGNOSIS — K589 Irritable bowel syndrome without diarrhea: Secondary | ICD-10-CM | POA: Insufficient documentation

## 2013-09-25 DIAGNOSIS — O99891 Other specified diseases and conditions complicating pregnancy: Secondary | ICD-10-CM | POA: Insufficient documentation

## 2013-09-25 DIAGNOSIS — O9989 Other specified diseases and conditions complicating pregnancy, childbirth and the puerperium: Secondary | ICD-10-CM

## 2013-09-25 NOTE — MAU Note (Signed)
Pt reports contractions, denies bleeding or ROM.  

## 2013-09-25 NOTE — MAU Note (Signed)
Pt states she has been having contractions since about 1700 today, pt states contractions started out at about 45 min apart and they are up to 6 min apart now.

## 2013-09-25 NOTE — MAU Provider Note (Signed)
History   38yo, [id number] at 2428w5d presents for labor check.  UCs since 1700 and now pt states UCs are 6 min apart.  However, pt reports that since sitting down on bed UCs have spaced out and are not as intense.  Denies VB, LOF, recent fever, resp or GI c/o's, UTI or PIH s/s. GFM. Desires epidural.  Chief Complaint  Patient presents with  . Labor Eval   HPI  OB History   Grav Para Term Preterm Abortions TAB SAB Ect Mult Living   3 2 2       2       Past Medical History  Diagnosis Date  . IBS (irritable bowel syndrome)   . Anxiety   . Depression   . BV (bacterial vaginosis)   . Blood type, Rh negative   . GBS (group B streptococcus) infection   . Hx: UTI (urinary tract infection)     Past Surgical History  Procedure Laterality Date  . Breast surgery  11/2008    Breast Implants  . Bunionectomy  09/2004    right bunion  . Lumbar laminectomy  09/2002  . Ganglion cyst excision  05/17/2001    Right wrist  . Knee surgery      Torn meniscus  . Breast lumpectomy  10/2004  . Foot surgery  09/2005    remove pins from previous surgery    Family History  Problem Relation Age of Onset  . Cancer Paternal Grandfather     LUNG  . Heart disease Paternal Grandmother   . Hyperlipidemia Father     History  Substance Use Topics  . Smoking status: Never Smoker   . Smokeless tobacco: Never Used  . Alcohol Use: Yes     Comment: Wine & beer on weekends    Allergies: No Known Allergies  Prescriptions prior to admission  Medication Sig Dispense Refill  . cetirizine (ZYRTEC) 10 MG tablet Take 10 mg by mouth daily.      Marland Kitchen. escitalopram (LEXAPRO) 20 MG tablet Take 30 mg by mouth daily.       Marland Kitchen. HYDROcodone-acetaminophen (NORCO) 10-325 MG per tablet Take 1 tablet by mouth every 6 (six) hours as needed for pain (pain).      Marland Kitchen. HYDROmorphone (DILAUDID) 2 MG tablet Take 1-2 tablets (2-4 mg total) by mouth every 4 (four) hours as needed for pain.  25 tablet  0  . prenatal vitamin w/FE, FA  (PRENATAL 1 + 1) 27-1 MG TABS tablet Take 1 tablet by mouth daily at 12 noon.        ROS ROS: see HPI above, all other systems are negative  Physical Exam   Blood pressure 122/78, pulse 95, temperature 97.7 F (36.5 C), temperature source Oral, resp. rate 18, height 5\' 7"  (1.702 m), weight 208 lb (94.348 kg), last menstrual period 09/13/2012, SpO2 100.00%.  Physical Exam Chest: Clear Heart: RRR Abdomen: gravid, NT Extremities: WNL  Dilation: 1 Effacement (%): 30 Cervical Position: Anterior Station: -2 Presentation: Vertex Exam by:: J. Anays Detore CNM  FHT: Reactive NST UCs: 3-6 min  ED Course  IUP at 3028w5d Labor check  No cervical change D/c home with precaution to f/u at [facility] at an already scheduled ROB on 1/22   Haroldine LawsOXLEY, Kalyani Maeda CNM, MSN 09/25/2013 11:15 PM

## 2013-09-26 NOTE — Discharge Instructions (Signed)

## 2013-09-29 ENCOUNTER — Inpatient Hospital Stay (HOSPITAL_COMMUNITY): Payer: Managed Care, Other (non HMO) | Admitting: Anesthesiology

## 2013-09-29 ENCOUNTER — Encounter (HOSPITAL_COMMUNITY): Payer: Self-pay | Admitting: *Deleted

## 2013-09-29 ENCOUNTER — Encounter (HOSPITAL_COMMUNITY): Payer: Managed Care, Other (non HMO) | Admitting: Anesthesiology

## 2013-09-29 ENCOUNTER — Inpatient Hospital Stay (HOSPITAL_COMMUNITY)
Admission: AD | Admit: 2013-09-29 | Discharge: 2013-09-30 | DRG: 775 | Disposition: A | Discharge status: Home / Self Care | Payer: Managed Care, Other (non HMO) | Source: Ambulatory Visit | Attending: Obstetrics and Gynecology | Admitting: Obstetrics and Gynecology

## 2013-09-29 DIAGNOSIS — E669 Obesity, unspecified: Secondary | ICD-10-CM | POA: Insufficient documentation

## 2013-09-29 DIAGNOSIS — R519 Headache, unspecified: Secondary | ICD-10-CM | POA: Insufficient documentation

## 2013-09-29 DIAGNOSIS — Z6791 Unspecified blood type, Rh negative: Secondary | ICD-10-CM | POA: Diagnosis present

## 2013-09-29 DIAGNOSIS — R51 Headache: Secondary | ICD-10-CM | POA: Diagnosis present

## 2013-09-29 DIAGNOSIS — O43199 Other malformation of placenta, unspecified trimester: Secondary | ICD-10-CM | POA: Diagnosis present

## 2013-09-29 DIAGNOSIS — O26899 Other specified pregnancy related conditions, unspecified trimester: Secondary | ICD-10-CM | POA: Diagnosis present

## 2013-09-29 DIAGNOSIS — O99214 Obesity complicating childbirth: Secondary | ICD-10-CM

## 2013-09-29 DIAGNOSIS — L299 Pruritus, unspecified: Secondary | ICD-10-CM | POA: Insufficient documentation

## 2013-09-29 DIAGNOSIS — O43899 Other placental disorders, unspecified trimester: Secondary | ICD-10-CM | POA: Diagnosis present

## 2013-09-29 DIAGNOSIS — O480 Post-term pregnancy: Secondary | ICD-10-CM | POA: Diagnosis present

## 2013-09-29 DIAGNOSIS — O360990 Maternal care for other rhesus isoimmunization, unspecified trimester, not applicable or unspecified: Secondary | ICD-10-CM | POA: Diagnosis present

## 2013-09-29 DIAGNOSIS — K589 Irritable bowel syndrome without diarrhea: Secondary | ICD-10-CM | POA: Diagnosis present

## 2013-09-29 LAB — CBC
HEMATOCRIT: 37.6 % (ref 36.0–46.0)
Hemoglobin: 13.2 g/dL (ref 12.0–15.0)
MCH: 29.9 pg (ref 26.0–34.0)
MCHC: 35.1 g/dL (ref 30.0–36.0)
MCV: 85.3 fL (ref 78.0–100.0)
Platelets: 255 10*3/uL (ref 150–400)
RBC: 4.41 MIL/uL (ref 3.87–5.11)
RDW: 13.4 % (ref 11.5–15.5)
WBC: 12.8 10*3/uL — ABNORMAL HIGH (ref 4.0–10.5)

## 2013-09-29 LAB — RPR: RPR Ser Ql: NONREACTIVE

## 2013-09-29 MED ORDER — EPHEDRINE 5 MG/ML INJ
10.0000 mg | INTRAVENOUS | Status: DC | PRN
  Filled 2013-09-29: qty 4
  Filled 2013-09-29: qty 2

## 2013-09-29 MED ORDER — ONDANSETRON HCL 4 MG PO TABS: 4.0000 mg | ORAL_TABLET | ORAL | Status: DC | PRN

## 2013-09-29 MED ORDER — OXYCODONE-ACETAMINOPHEN 5-325 MG PO TABS
1.0000 | ORAL_TABLET | ORAL | Status: DC | PRN
  Administered 2013-09-29 – 2013-09-30 (×4): 1 via ORAL
  Filled 2013-09-29 (×4): qty 1

## 2013-09-29 MED ORDER — LACTATED RINGERS IV SOLN
500.0000 mL | INTRAVENOUS | Status: DC | PRN
  Administered 2013-09-29: 1000 mL via INTRAVENOUS

## 2013-09-29 MED ORDER — DIPHENHYDRAMINE HCL 25 MG PO CAPS: 25.0000 mg | ORAL_CAPSULE | Freq: Four times a day (QID) | ORAL | Status: DC | PRN

## 2013-09-29 MED ORDER — OXYTOCIN BOLUS FROM INFUSION: 500.0000 mL | INTRAVENOUS | Status: DC

## 2013-09-29 MED ORDER — PHENYLEPHRINE 40 MCG/ML (10ML) SYRINGE FOR IV PUSH (FOR BLOOD PRESSURE SUPPORT)
80.0000 ug | PREFILLED_SYRINGE | INTRAVENOUS | Status: DC | PRN
  Filled 2013-09-29: qty 10
  Filled 2013-09-29: qty 2

## 2013-09-29 MED ORDER — TETANUS-DIPHTH-ACELL PERTUSSIS 5-2.5-18.5 LF-MCG/0.5 IM SUSP: 0.5000 mL | Freq: Once | INTRAMUSCULAR | Status: DC

## 2013-09-29 MED ORDER — PRENATAL MULTIVITAMIN CH
1.0000 | ORAL_TABLET | Freq: Every day | ORAL | Status: DC
  Administered 2013-09-30: 1 via ORAL
  Filled 2013-09-29: qty 1

## 2013-09-29 MED ORDER — IBUPROFEN 600 MG PO TABS: 600.0000 mg | ORAL_TABLET | Freq: Four times a day (QID) | ORAL | Status: DC | PRN

## 2013-09-29 MED ORDER — BENZOCAINE-MENTHOL 20-0.5 % EX AERO
1.0000 "application " | INHALATION_SPRAY | CUTANEOUS | Status: DC | PRN
  Administered 2013-09-29: 1 via TOPICAL

## 2013-09-29 MED ORDER — LANOLIN HYDROUS EX OINT: TOPICAL_OINTMENT | CUTANEOUS | Status: DC | PRN

## 2013-09-29 MED ORDER — ACETAMINOPHEN 325 MG PO TABS: 650.0000 mg | ORAL_TABLET | ORAL | Status: DC | PRN

## 2013-09-29 MED ORDER — ZOLPIDEM TARTRATE 5 MG PO TABS: 5.0000 mg | ORAL_TABLET | Freq: Every evening | ORAL | Status: DC | PRN

## 2013-09-29 MED ORDER — LACTATED RINGERS IV SOLN: 500.0000 mL | Freq: Once | INTRAVENOUS | Status: DC

## 2013-09-29 MED ORDER — LACTATED RINGERS IV SOLN
INTRAVENOUS | Status: DC
  Administered 2013-09-29: 12:00:00 via INTRAVENOUS

## 2013-09-29 MED ORDER — EPHEDRINE 5 MG/ML INJ
10.0000 mg | INTRAVENOUS | Status: DC | PRN
  Filled 2013-09-29: qty 2

## 2013-09-29 MED ORDER — OXYTOCIN 40 UNITS IN LACTATED RINGERS INFUSION - SIMPLE MED
62.5000 mL/h | INTRAVENOUS | Status: DC
  Administered 2013-09-29: 999 mL/h via INTRAVENOUS
  Filled 2013-09-29: qty 1000

## 2013-09-29 MED ORDER — OXYCODONE-ACETAMINOPHEN 5-325 MG PO TABS: 1.0000 | ORAL_TABLET | ORAL | Status: DC | PRN

## 2013-09-29 MED ORDER — ONDANSETRON HCL 4 MG/2ML IJ SOLN: 4.0000 mg | INTRAMUSCULAR | Status: DC | PRN

## 2013-09-29 MED ORDER — DIPHENHYDRAMINE HCL 50 MG/ML IJ SOLN: 12.5000 mg | INTRAMUSCULAR | Status: DC | PRN

## 2013-09-29 MED ORDER — PHENYLEPHRINE 40 MCG/ML (10ML) SYRINGE FOR IV PUSH (FOR BLOOD PRESSURE SUPPORT)
80.0000 ug | PREFILLED_SYRINGE | INTRAVENOUS | Status: DC | PRN
  Filled 2013-09-29: qty 2

## 2013-09-29 MED ORDER — LIDOCAINE HCL (PF) 1 % IJ SOLN
30.0000 mL | INTRAMUSCULAR | Status: DC | PRN
  Administered 2013-09-29: 30 mL via SUBCUTANEOUS
  Filled 2013-09-29 (×2): qty 30

## 2013-09-29 MED ORDER — SIMETHICONE 80 MG PO CHEW: 80.0000 mg | CHEWABLE_TABLET | ORAL | Status: DC | PRN

## 2013-09-29 MED ORDER — PANTOPRAZOLE SODIUM 40 MG PO TBEC
40.0000 mg | DELAYED_RELEASE_TABLET | Freq: Every day | ORAL | Status: DC
  Administered 2013-09-30: 40 mg via ORAL
  Filled 2013-09-29 (×3): qty 1

## 2013-09-29 MED ORDER — DIBUCAINE 1 % RE OINT: 1.0000 "application " | TOPICAL_OINTMENT | RECTAL | Status: DC | PRN

## 2013-09-29 MED ORDER — IBUPROFEN 600 MG PO TABS
600.0000 mg | ORAL_TABLET | Freq: Four times a day (QID) | ORAL | Status: DC
  Administered 2013-09-29 – 2013-09-30 (×4): 600 mg via ORAL
  Filled 2013-09-29 (×4): qty 1

## 2013-09-29 MED ORDER — CITRIC ACID-SODIUM CITRATE 334-500 MG/5ML PO SOLN: 30.0000 mL | ORAL | Status: DC | PRN

## 2013-09-29 MED ORDER — ONDANSETRON HCL 4 MG/2ML IJ SOLN: 4.0000 mg | Freq: Four times a day (QID) | INTRAMUSCULAR | Status: DC | PRN

## 2013-09-29 MED ORDER — LIDOCAINE HCL (PF) 1 % IJ SOLN
INTRAMUSCULAR | Status: DC | PRN
  Administered 2013-09-29 (×2): 5 mL

## 2013-09-29 MED ORDER — WITCH HAZEL-GLYCERIN EX PADS: 1.0000 "application " | MEDICATED_PAD | CUTANEOUS | Status: DC | PRN

## 2013-09-29 MED ORDER — SENNOSIDES-DOCUSATE SODIUM 8.6-50 MG PO TABS
2.0000 | ORAL_TABLET | ORAL | Status: DC
  Administered 2013-09-29: 2 via ORAL
  Filled 2013-09-29: qty 2

## 2013-09-29 MED ORDER — FENTANYL 2.5 MCG/ML BUPIVACAINE 1/10 % EPIDURAL INFUSION (WH - ANES)
14.0000 mL/h | INTRAMUSCULAR | Status: DC | PRN
  Administered 2013-09-29: 14 mL/h via EPIDURAL
  Filled 2013-09-29: qty 125

## 2013-09-29 NOTE — MAU Note (Signed)
Contractions since 0430. No bleeding or leaking

## 2013-09-29 NOTE — Progress Notes (Signed)
  Subjective: Pt comfortable with epidural.  Objective: BP 128/75  Pulse 83  Temp(Src) 98.2 F (36.8 C)  Resp 18  Ht 5\' 7"  (1.702 m)  Wt 208 lb (94.348 kg)  BMI 32.57 kg/m2  SpO2 99%  LMP 09/13/2012      FHT:  Cat I UC:   regular, every 3-6 minutes  SVE:   Dilation: 6 (bulding bag) Effacement (%): 80 Station: -2 Exam by:: Lavaya Defreitas, cnm  Assessment / Plan:  Spontaneous labor, progressing normally  Labor: Progressing normally  Preeclampsia: no signs or symptoms of toxicity  Fetal Wellbeing: Category I  Pain Control: Epidural  I/D: GBS neg; Intact; Afebrile  Anticipated MOD: NSVD   Halo Laski 09/29/2013, 9:05 AM

## 2013-09-29 NOTE — Anesthesia Preprocedure Evaluation (Addendum)
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     Plan Discussed with:   Anesthesia Plan Comments:         Anesthesia Quick Evaluation

## 2013-09-29 NOTE — Anesthesia Procedure Notes (Signed)
Epidural Patient location during procedure: OB Start time: 09/29/2013 8:07 AM  Staffing Anesthesiologist: Brayton CavesJACKSON, Naima Veldhuizen Performed by: anesthesiologist   Preanesthetic Checklist Completed: patient identified, site marked, surgical consent, pre-op evaluation, timeout performed, IV checked, risks and benefits discussed and monitors and equipment checked  Epidural Patient position: sitting Prep: site prepped and draped and DuraPrep Patient monitoring: continuous pulse ox and blood pressure Approach: midline Injection technique: LOR air  Needle:  Needle type: Tuohy  Needle gauge: 17 G Needle length: 9 cm and 9 Needle insertion depth: 7 cm Catheter type: closed end flexible Catheter size: 19 Gauge Catheter at skin depth: 12 cm Test dose: negative  Assessment Events: blood not aspirated, injection not painful, no injection resistance, negative IV test and no paresthesia  Additional Notes Patient identified.  Risk benefits discussed including failed block, incomplete pain control, headache, nerve damage, paralysis, blood pressure changes, nausea, vomiting, reactions to medication both toxic or allergic, and postpartum back pain.  Patient expressed understanding and wished to proceed.  All questions were answered.  Sterile technique used throughout procedure and epidural site dressed with sterile barrier dressing. No paresthesia or other complications noted.The patient did not experience any signs of intravascular injection such as tinnitus or metallic taste in mouth nor signs of intrathecal spread such as rapid motor block. Please see nursing notes for vital signs.

## 2013-09-29 NOTE — Progress Notes (Signed)
  Subjective: [facility] comfortable with epidural, [facility]  Objective: BP 116/80  Pulse 83  Temp(Src) 98.2 F (36.8 C)  Resp 18  Ht 5\' 7"  (1.702 m)  Wt 208 lb (94.348 kg)  BMI 32.57 kg/m2  SpO2 99%  LMP 09/13/2012      FHT:  Cat I UC:   regular, every 3-4 minutes  SVE:   Dilation: 7.5 Effacement (%): 100 Station: -1 Exam by:: berrier rn  Assessment / Plan:  Spontaneous labor, progressing normally; AROM, clear fluid, tolerated well Labor: Progressing normally  Preeclampsia: no signs or symptoms of toxicity  Fetal Wellbeing: Category I  Pain Control: Epidural  I/D: GBS neg; AROM at 1200; Afebrile  Anticipated MOD: NSVD   Sharae Zappulla 09/29/2013, 12:02 PM

## 2013-09-29 NOTE — Lactation Note (Signed)
This note was copied from the chart of Boy Anise Salvomanda Montufar. Lactation Consultation Note Initial visit at 8 hours of age.  Baby latched in cradle hold with wide flanged lips and rhythmic suckling.  Encouraged mom to try cross cradle for deeper latch as mom reports a little pain sometimes.  [facility] given and discussed.  Breast feeding basics discussed, encouraged to feed with cues, skin to skin and discussed cluster feeding.  Mom reports unable to hand express, I demonstrated on left breast with colostrum visible.  Mom to call for assist as needed and to let [facility] RN do latch scores.    Patient Name: Boy Anise Salvomanda Wickwire ZOXWR'UToday's Date: 09/29/2013 Reason for consult: Initial assessment   Maternal Data Formula Feeding for Exclusion: No Infant to breast within first hour of birth: Yes Has patient been taught Hand Expression?: Yes Does the patient have breastfeeding experience prior to this delivery?: Yes  Feeding Feeding Type: Breast Fed Length of feed: 12 min  LATCH Score/Interventions Latch: Grasps breast easily, tongue down, lips flanged, rhythmical sucking.  Audible Swallowing: A few with stimulation Intervention(s): Hand expression  Type of Nipple: Everted at rest and after stimulation  Comfort (Breast/Nipple): Soft / non-tender     Hold (Positioning): Assistance needed to correctly position infant at breast and maintain latch. Intervention(s): Position options;Support Pillows;Breastfeeding basics reviewed  LATCH Score: 8  Lactation Tools Discussed/Used     Consult Status Consult Status: Follow-up Date: 09/30/13 Follow-up type: In-patient    Beverely RisenShoptaw, Arvella MerlesJana Lynn 09/29/2013, 9:52 PM

## 2013-09-29 NOTE — Progress Notes (Signed)
Report called to DAna RN in BS. Pt to 173 via w/c

## 2013-09-29 NOTE — Progress Notes (Signed)
Gevena BarreS. Lillard CNM notified of pt's admission and status. [facility]

## 2013-09-29 NOTE — H&P (Signed)
Allison Nelson is a 38 y.o. female, G3P2002 at [redacted]w[redacted]d, presenting for active labor.  Denies VB, LOF, recent fever, resp or GI c/o's, UTI or PIH s/s. GFM. Desires epidural.  Patient Active Problem List   Diagnosis Date Noted  . Normal labor 09/29/2013  . Obesity, unspecified 09/29/2013  . Post term pregnancy over 40 weeks 09/29/2013  . 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 Pruritus 09/29/2013  . Chronic headache 09/29/2013  . IBS (irritable bowel syndrome) 03/06/2012  . Ovarian cyst 03/06/2012  . Blood type, Rh negative 03/06/2012    History of present pregnancy: Patient entered care at 12 weeks.   EDC of 09/27/13 was established by LMP.   Anatomy scan:  18 weeks, with normal findings and an anterior placenta.  Accessory or succenturiate lobe of placenta located on the posterior uterine wall.   Additional [place] evaluations:  [redacted]w[redacted]d for growth - Vtx, anterior placenta, fluid normal, EFW 82nd%ile.   Significant prenatal events:  U/s shows small right testicle hydrocele at 31 weeks.  Chronic HA with pain in arm.  Seen by HA specialist and neurologist. Last evaluation:  09/26/13 at [redacted]w[redacted]d    2 cm / 50% / -2  OB History   Grav Para Term Preterm Abortions TAB SAB Ect Mult Living   3 2 2       2      Past Medical History  Diagnosis Date  . IBS (irritable bowel syndrome)   . Anxiety   . Depression   . BV (bacterial vaginosis)   . Blood type, Rh negative   . GBS (group B streptococcus) infection   . Hx: UTI (urinary tract infection)    Past Surgical History  Procedure Laterality Date  . Breast surgery  11/2008    Breast Implants  . Bunionectomy  09/2004    right bunion  . Lumbar laminectomy  09/2002  . Ganglion cyst excision  05/17/2001    Right wrist  . Knee surgery      Torn meniscus  . Breast lumpectomy  10/2004  . Foot surgery  09/2005    remove pins from previous surgery   Family History: family history includes Cancer in her paternal grandfather; Heart disease in her paternal  grandmother; Hyperlipidemia in her father. Social History:  reports that she has never smoked. She has never used smokeless tobacco. She reports that she drinks alcohol. She reports that she does not use illicit drugs.   Prenatal Transfer Tool  Maternal Diabetes: No Genetic Screening: Normal Maternal Ultrasounds/Referrals: Normal Fetal Ultrasounds or other Referrals:  None Maternal Substance Abuse:  No Significant Maternal Medications:  None Significant Maternal Lab Results: Lab values include: Group B Strep negative, Rh negative    ROS: see HPI above, all other systems are negative   No Known Allergies   Dilation: 4 Effacement (%): 70 Station: -2 Exam by:: Quintella Baton RNC Blood pressure 112/71, pulse 85, temperature 98.2 F (36.8 C), resp. rate 18, height 5\' 7"  (1.702 m), weight 208 lb (94.348 kg), last menstrual period 09/13/2012, SpO2 97.00%.  Chest clear Heart RRR without murmur Abd gravid, NT Ext: WNL  FHR: Reactive NST UCs:  Q 3-6 min  Prenatal labs: ABO, Rh: --/--/A NEG (01/25 0655) Antibody: POS (01/25 0655) Rubella:   Immune RPR:   Neg HBsAg:   Neg HIV:   Neg GBS:  Neg Sickle cell/Hgb electrophoresis:  N/A Pap:  10/09/12 WNL GC:  Neg Chlamydia:  Neg Genetic screenings:  1st Trimester normal Glucola:  85 Other:  Bile salts and PIH labs on 7/30    Assessment/Plan: IUP at 3455w2d Active labor GBS neg Succenturiate lobe Desires epidural  Admit to BS per c/w Dr. Su Hiltoberts (by Sanda KleinShelley Lillard) Routine L&D orders Epidural PRN   Rowan BlaseXLEY, JENNIFERCNM, MSN 09/29/2013, 8:38 AM

## 2013-09-30 LAB — CBC
HEMATOCRIT: 33.2 % — AB (ref 36.0–46.0)
HEMOGLOBIN: 11.2 g/dL — AB (ref 12.0–15.0)
MCH: 29.2 pg (ref 26.0–34.0)
MCHC: 33.7 g/dL (ref 30.0–36.0)
MCV: 86.7 fL (ref 78.0–100.0)
Platelets: 240 10*3/uL (ref 150–400)
RBC: 3.83 MIL/uL — ABNORMAL LOW (ref 3.87–5.11)
RDW: 13.5 % (ref 11.5–15.5)
WBC: 15.1 10*3/uL — ABNORMAL HIGH (ref 4.0–10.5)

## 2013-09-30 MED ORDER — IBUPROFEN 600 MG PO TABS: 600.0000 mg | ORAL_TABLET | Freq: Four times a day (QID) | ORAL | Status: DC

## 2013-09-30 MED ORDER — OXYCODONE-ACETAMINOPHEN 5-325 MG PO TABS: 1.0000 | ORAL_TABLET | ORAL | Status: DC | PRN

## 2013-09-30 MED ORDER — SENNOSIDES-DOCUSATE SODIUM 8.6-50 MG PO TABS: 2.0000 | ORAL_TABLET | ORAL | Status: DC

## 2013-09-30 NOTE — Lactation Note (Signed)
This note was copied from the chart of Allison Nelson. Lactation Consultation Note  Patient Name: Allison Anise Salvomanda Yott AOZHY'QToday's Date: 09/30/2013 Reason for consult: Follow-up assessment Mom reports baby is nursing well, she reports hearing occasional swallows. Mom is experienced BF, however, she has had breast augmentation since her last baby. Mom reports her incision for surgery was under her arm, no surgery around the nipple. Reviewed with Mom the importance of preventing engorgement, monitoring void/stools till milk supply is established, frequent breastfeeding. Engorgement care reviewed if needed. Offered to observe latch before d/c today. Mom declined assistance and reports she will call if she would like LC assist. Reminded of OP services and support group.   Maternal Data    Feeding Feeding Type: Breast Fed Length of feed: 10 min  LATCH Score/Interventions                      Lactation Tools Discussed/Used     Consult Status Consult Status: Complete Date: 09/30/13 Follow-up type: In-patient    Alfred LevinsGranger, Jaquanna Ballentine Ann 09/30/2013, 12:05 PM

## 2013-09-30 NOTE — Anesthesia Postprocedure Evaluation (Signed)
Anesthesia Post Note  Patient: Allison Nelson  Procedure(s) Performed: * No procedures listed *  Anesthesia type: Epidural  Patient location: Mother/Baby  Post pain: Pain level controlled  Post assessment: Post-op Vital signs reviewed  Last Vitals:  Filed Vitals:   09/30/13 0415  BP: 114/70  Pulse: 71  Temp: 36.6 C  Resp: 20    Post vital signs: Reviewed  Level of consciousness: awake  Complications: No apparent anesthesia complications

## 2013-09-30 NOTE — Progress Notes (Signed)
Patient was referred for history of depression/anxiety. * Referral screened out by Clinical Social Worker because none of the following criteria appear to apply:  ~ History of anxiety/depression during this pregnancy, or of post-partum depression.  ~ Diagnosis of anxiety and/or depression within last 3 years  ~ History of depression due to pregnancy loss/loss of child  OR * Patient's symptoms currently being treated with medication (Lexapro) and/or therapy.  Please contact the Clinical Social Worker if needs arise, or by the patient's request.  

## 2013-09-30 NOTE — Discharge Summary (Signed)
Obstetric Discharge Summary  Reason for Admission: onset of labor Prenatal Procedures: none Intrapartum Procedures: spontaneous vaginal delivery by Haroldine LawsJennifer Oxley CNM Postpartum Procedures: none Complications-Operative and Postpartum: none  Hemoglobin  Date Value Range Status  09/30/2013 11.2* 12.0 - 15.0 g/dL Final     HCT  Date Value Range Status  09/30/2013 33.2* 36.0 - 46.0 % Final    Discharge Diagnoses: Term Pregnancy-delivered  Discharge Information: per [facility] booklet  Date: 09/30/2013 Activity: unrestricted Diet: routine Medications: Ibuprofen, Colace and Percocet Condition: stable  Breastfeeding: yes  Instructions: refer to practice specific booklet Discharge to: home  Contraception: desires Mirena ( previous user)   Newborn Data: Live born  Information for the patient's newborn:  Allison Nelson, Allison Nelson [[id number]][[id number]]  female  Home with mother.  Allison Hornbaker A MD 09/30/2013, 10:43 AM

## 2013-10-03 LAB — TYPE AND SCREEN
ABO/RH(D): A NEG
Antibody Screen: POSITIVE
DAT, IgG: NEGATIVE
Unit division: 0
Unit division: 0

## 2013-11-08 DIAGNOSIS — M5412 Radiculopathy, cervical region: Secondary | ICD-10-CM | POA: Insufficient documentation

## 2014-04-26 DIAGNOSIS — G5603 Carpal tunnel syndrome, bilateral upper limbs: Secondary | ICD-10-CM | POA: Insufficient documentation

## 2014-07-07 ENCOUNTER — Encounter (HOSPITAL_COMMUNITY): Payer: Self-pay | Admitting: *Deleted

## 2014-09-14 ENCOUNTER — Emergency Department (HOSPITAL_COMMUNITY)
Admission: EM | Admit: 2014-09-14 | Discharge: 2014-09-14 | Disposition: A | Discharge status: Home / Self Care | Payer: Managed Care, Other (non HMO) | Attending: Emergency Medicine | Admitting: Emergency Medicine

## 2014-09-14 ENCOUNTER — Emergency Department (HOSPITAL_COMMUNITY): Payer: Managed Care, Other (non HMO)

## 2014-09-14 ENCOUNTER — Encounter (HOSPITAL_COMMUNITY): Payer: Self-pay | Admitting: Emergency Medicine

## 2014-09-14 DIAGNOSIS — Y9289 Other specified places as the place of occurrence of the external cause: Secondary | ICD-10-CM | POA: Diagnosis not present

## 2014-09-14 DIAGNOSIS — Y9389 Activity, other specified: Secondary | ICD-10-CM | POA: Insufficient documentation

## 2014-09-14 DIAGNOSIS — F329 Major depressive disorder, single episode, unspecified: Secondary | ICD-10-CM | POA: Insufficient documentation

## 2014-09-14 DIAGNOSIS — Z79899 Other long term (current) drug therapy: Secondary | ICD-10-CM | POA: Insufficient documentation

## 2014-09-14 DIAGNOSIS — W010XXA Fall on same level from slipping, tripping and stumbling without subsequent striking against object, initial encounter: Secondary | ICD-10-CM | POA: Insufficient documentation

## 2014-09-14 DIAGNOSIS — Z8742 Personal history of other diseases of the female genital tract: Secondary | ICD-10-CM | POA: Insufficient documentation

## 2014-09-14 DIAGNOSIS — Y998 Other external cause status: Secondary | ICD-10-CM | POA: Diagnosis not present

## 2014-09-14 DIAGNOSIS — M25552 Pain in left hip: Secondary | ICD-10-CM

## 2014-09-14 DIAGNOSIS — F419 Anxiety disorder, unspecified: Secondary | ICD-10-CM | POA: Insufficient documentation

## 2014-09-14 DIAGNOSIS — Z87440 Personal history of urinary (tract) infections: Secondary | ICD-10-CM | POA: Diagnosis not present

## 2014-09-14 DIAGNOSIS — S79912A Unspecified injury of left hip, initial encounter: Secondary | ICD-10-CM | POA: Diagnosis present

## 2014-09-14 DIAGNOSIS — S7002XA Contusion of left hip, initial encounter: Secondary | ICD-10-CM | POA: Diagnosis not present

## 2014-09-14 DIAGNOSIS — Z8619 Personal history of other infectious and parasitic diseases: Secondary | ICD-10-CM | POA: Insufficient documentation

## 2014-09-14 DIAGNOSIS — Z8719 Personal history of other diseases of the digestive system: Secondary | ICD-10-CM | POA: Insufficient documentation

## 2014-09-14 DIAGNOSIS — W19XXXA Unspecified fall, initial encounter: Secondary | ICD-10-CM

## 2014-09-14 LAB — URINALYSIS, ROUTINE W REFLEX MICROSCOPIC
Bilirubin Urine: NEGATIVE
Glucose, UA: NEGATIVE mg/dL
Ketones, ur: 15 mg/dL — AB
Leukocytes, UA: NEGATIVE
Nitrite: NEGATIVE
PH: 6 (ref 5.0–8.0)
Protein, ur: NEGATIVE mg/dL
Specific Gravity, Urine: 1.027 (ref 1.005–1.030)
UROBILINOGEN UA: 0.2 mg/dL (ref 0.0–1.0)

## 2014-09-14 LAB — URINE MICROSCOPIC-ADD ON

## 2014-09-14 MED ORDER — HYDROMORPHONE HCL 1 MG/ML IJ SOLN
2.0000 mg | Freq: Once | INTRAMUSCULAR | Status: AC
  Administered 2014-09-14: 2 mg via INTRAMUSCULAR
  Filled 2014-09-14: qty 2

## 2014-09-14 MED ORDER — NAPROXEN 250 MG PO TABS: 500.0000 mg | ORAL_TABLET | Freq: Once | ORAL | Status: DC

## 2014-09-14 MED ORDER — DIAZEPAM 2 MG PO TABS
2.0000 mg | ORAL_TABLET | Freq: Once | ORAL | Status: AC
  Administered 2014-09-14: 2 mg via ORAL
  Filled 2014-09-14: qty 1

## 2014-09-14 MED ORDER — OXYCODONE-ACETAMINOPHEN 5-325 MG PO TABS: 1.0000 | ORAL_TABLET | ORAL | Status: DC | PRN

## 2014-09-14 NOTE — ED Provider Notes (Signed)
CSN: [id number]     Arrival date & time 09/14/14  1539 History  This chart was scribed for non-physician practitioner, Harle Battiest, NP-C working with Juliet Rude. Rubin Payor, MD, by Abel Presto, ED Scribe. This patient was seen in room TR10C/TR10C and the patient's care was started at 5:00 PM.     Chief Complaint  Patient presents with  . Fall  . Hip Injury     Patient is a 39 y.o. female presenting with fall. The history is provided by the patient. No language interpreter was used.  Fall    HPI Comments: Allison Nelson is a 39 y.o. 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  Pt notes she tripped over a riding lawn mower and fell onto concrete onto her left hip. Pt notes associated left hip pain, abrasion and bruising.  She states she has hip pain at baseline but this is more severe.  Pt took a hydrocodone without relief.  She states the pain is not superficial. Pt has an IUD. Pt denies any other injuries.  Past Medical History  Diagnosis Date  . IBS (irritable bowel syndrome)   . Anxiety   . Depression   . BV (bacterial vaginosis)   . Blood type, Rh negative   . GBS (group B streptococcus) infection   . Hx: UTI (urinary tract infection)    Past Surgical History  Procedure Laterality Date  . Breast surgery  11/2008    Breast Implants  . Bunionectomy  09/2004    right bunion  . Lumbar laminectomy  09/2002  . Ganglion cyst excision  05/17/2001    Right wrist  . Knee surgery      Torn meniscus  . Breast lumpectomy  10/2004  . Foot surgery  09/2005    remove pins from previous surgery   Family History  Problem Relation Age of Onset  . Cancer Paternal Grandfather     LUNG  . Heart disease Paternal Grandmother   . Hyperlipidemia Father    History  Substance Use Topics  . Smoking status: Never Smoker   . Smokeless tobacco: Never Used  . Alcohol Use: Yes     Comment:  Wine & beer on weekends   OB History    Gravida Para Term Preterm AB TAB SAB Ectopic Multiple Living   Review of Systems  Musculoskeletal: Positive for myalgias and arthralgias.  Skin: Positive for wound.  All other systems reviewed and are negative.     Allergies  Review of patient's allergies indicates no known allergies.  Home Medications   Prior to Admission medications   Medication Sig Start Date End Date Taking? Authorizing Provider  calcium carbonate (OS-CAL) 1250 MG chewable tablet Chew 2 tablets by mouth as needed for heartburn.    Historical Provider, MD  cetirizine (ZYRTEC) 10 MG tablet Take 10 mg by mouth daily.    Historical Provider, MD  escitalopram (LEXAPRO) 20 MG tablet Take 30 mg by mouth daily.     Historical Provider, MD  ibuprofen (ADVIL,MOTRIN) 600 MG tablet Take 1 tablet (600 mg total) by mouth every 6 (six) hours. 09/30/13   Silverio Lay, MD  oxyCODONE-acetaminophen (PERCOCET/ROXICET) 5-325 MG per tablet Take 1-2 tablets by mouth every 4 (four) hours as needed for severe pain (moderate - severe pain). 09/30/13   Silverio Lay, MD  prenatal vitamin w/FE, FA (PRENATAL 1 + 1) 27-1 MG TABS tablet Take 1 tablet by mouth daily at 12 noon.    Historical Provider, MD  ranitidine (ZANTAC) 75 MG tablet Take 75 mg by mouth daily as needed for heartburn.    Historical Provider, MD  senna-docusate (SENOKOT-S) 8.6-50 MG per tablet Take 2 tablets by mouth daily. 09/30/13   Silverio LaySandra Rivard, MD   BP 107/81 mmHg  Pulse 86  Temp(Src) 97.8 F (36.6 C) (Oral)  Resp 18  Ht 5\' 7"  (1.702 m)  Wt 180 lb (81.647 kg)  BMI 28.19 kg/m2  SpO2 100% Physical Exam  Constitutional: She is oriented to person, place, and time. She appears well-developed and well-nourished.  HENT:  Head: Normocephalic.  Eyes: Conjunctivae are normal.  Neck: Normal range of motion. Neck supple.  Pulmonary/Chest: Effort normal.  Musculoskeletal: Normal range of motion.       Left hip:  She exhibits tenderness and bony tenderness. She exhibits normal range of motion.       Legs: 3-4 cm of ecchymosis and TTP along iliac crest ROM limited due to pain  Neurological: She is alert and oriented to person, place, and time.  Skin: Skin is warm and dry.  Psychiatric: She has a normal mood and affect. Her behavior is normal.  Nursing note and vitals reviewed.   ED Course  Procedures (including critical care time) DIAGNOSTIC STUDIES: Oxygen Saturation is 100% on room air, normal by my interpretation.    COORDINATION OF CARE: 5:07 PM Discussed treatment plan with patient at beside, the patient agrees with the plan and has no further questions at this time.   Labs Review Labs Reviewed  URINALYSIS, ROUTINE W REFLEX MICROSCOPIC - Abnormal; Notable for the following:    Hgb urine dipstick TRACE (*)    Ketones, ur 15 (*)    All other components within normal limits  URINE MICROSCOPIC-ADD ON    Imaging Review Dg Hip Unilat With Pelvis 2-3 Views Left  09/14/2014   CLINICAL DATA:  Initial evaluation for anterior left hip pain after fall 1 day ago  EXAM: DG HIP W/ PELVIS 2-3V*L*  COMPARISON:  None.  FINDINGS: Pelvic bones intact. Intrauterine device noted. No evidence of proximal femur fracture or dislocation.  IMPRESSION: Negative   Electronically Signed   By: Esperanza Heiraymond  Rubner M.D.   On: 09/14/2014 18:36     EKG Interpretation None      MDM   Final diagnoses:  Fall  Hip pain, left   39 yo with fall from standing position after tripping over a lawnmower. Her X-Ray is negative for obvious fracture or dislocation. Her pain was managed in the ED. She will be instructed to continue to take her anti-inflammatory meds and a short course of opioid meds will be provided. Pt advised to follow up with orthopedics if symptoms persist. Conservative therapy recommended and discussed. Patient will be dc home & is agreeable with above plan.    I personally performed the services described  in this documentation, which was scribed in my presence. The recorded information has been reviewed and is accurate.  Filed Vitals:   09/14/14 1620 09/14/14 1621 09/14/14 1910  BP: 107/81  113/64  Pulse: 86  73  Temp: 97.8 F (36.6 C)  98.8 F (37.1 C)  TempSrc: Oral  Oral  Resp: 18  18  Height: 5\' 7"  (1.702 m)    Weight: 180 lb (81.647 kg)    SpO2:  100% 98%   Meds  given in ED:  Medications  diazepam (VALIUM) tablet 2 mg (2 mg Oral Given 09/14/14 1751)  HYDROmorphone (DILAUDID) injection 2 mg (2 mg Intramuscular Given 09/14/14 1736)    Discharge Medication List as of 09/14/2014  6:53 PM    START taking these medications   Details  !! oxyCODONE-acetaminophen (PERCOCET/ROXICET) 5-325 MG per tablet Take 1-2 tablets by mouth every 4 (four) hours as needed for moderate pain or severe pain., Starting 09/14/2014, Until Discontinued, Print     !! - Potential duplicate medications found. Please discuss with provider.        Harle Battiest, NP 09/15/14 1236  Juliet Rude. Rubin Payor, MD 09/17/14 [phone]

## 2014-09-14 NOTE — ED Notes (Signed)
Pt c/o fall last night causing injury and pain to left hip.

## 2014-09-14 NOTE — ED Notes (Signed)
Declined W/C at D/C and was escorted to lobby by RN. 

## 2014-09-14 NOTE — Discharge Instructions (Signed)
Follow directions provided. Be sure to follow-up with your primary care doctor later this week to ensure you're getting better. If your pain continues or worsens be sure to follow-up with your orthopedic doctor for further management. Take your anti-inflammatory medicine as directed, and the pain medicine for pain not relieved by the anti-inflammatory. Don't hesitate to return for any new, worsening, or concerning symptoms.   SEEK MEDICAL CARE IF:  You are unable to put weight on your leg.  Your hip is red or swollen or very tender to touch.  Your pain or swelling continues or worsens after 1 week.  You have increasing difficulty walking.  You have a fever. SEEK IMMEDIATE MEDICAL CARE IF:  You have fallen.  You have a sudden increase in pain and swelling in your hip.

## 2014-09-14 NOTE — ED Notes (Signed)
Pt transported to bathroom to collect POC preg urine.

## 2014-10-22 ENCOUNTER — Encounter (HOSPITAL_COMMUNITY): Payer: Self-pay | Admitting: Emergency Medicine

## 2014-10-22 ENCOUNTER — Emergency Department (HOSPITAL_COMMUNITY)
Admission: EM | Admit: 2014-10-22 | Discharge: 2014-10-22 | Disposition: A | Discharge status: Home / Self Care | Payer: Managed Care, Other (non HMO) | Attending: Emergency Medicine | Admitting: Emergency Medicine

## 2014-10-22 DIAGNOSIS — Z8742 Personal history of other diseases of the female genital tract: Secondary | ICD-10-CM | POA: Insufficient documentation

## 2014-10-22 DIAGNOSIS — R112 Nausea with vomiting, unspecified: Secondary | ICD-10-CM | POA: Insufficient documentation

## 2014-10-22 DIAGNOSIS — Z8619 Personal history of other infectious and parasitic diseases: Secondary | ICD-10-CM | POA: Diagnosis not present

## 2014-10-22 DIAGNOSIS — Z8719 Personal history of other diseases of the digestive system: Secondary | ICD-10-CM | POA: Diagnosis not present

## 2014-10-22 DIAGNOSIS — F419 Anxiety disorder, unspecified: Secondary | ICD-10-CM | POA: Diagnosis not present

## 2014-10-22 DIAGNOSIS — F329 Major depressive disorder, single episode, unspecified: Secondary | ICD-10-CM | POA: Insufficient documentation

## 2014-10-22 DIAGNOSIS — Z79899 Other long term (current) drug therapy: Secondary | ICD-10-CM | POA: Diagnosis not present

## 2014-10-22 DIAGNOSIS — R1033 Periumbilical pain: Secondary | ICD-10-CM | POA: Insufficient documentation

## 2014-10-22 DIAGNOSIS — Z87440 Personal history of urinary (tract) infections: Secondary | ICD-10-CM | POA: Diagnosis not present

## 2014-10-22 DIAGNOSIS — R109 Unspecified abdominal pain: Secondary | ICD-10-CM | POA: Diagnosis present

## 2014-10-22 LAB — COMPREHENSIVE METABOLIC PANEL
ALT: 22 U/L (ref 0–35)
AST: 35 U/L (ref 0–37)
Albumin: 4.7 g/dL (ref 3.5–5.2)
Alkaline Phosphatase: 63 U/L (ref 39–117)
Anion gap: 14 (ref 5–15)
BUN: 16 mg/dL (ref 6–23)
CALCIUM: 9.7 mg/dL (ref 8.4–10.5)
CO2: 25 mmol/L (ref 19–32)
CREATININE: 0.88 mg/dL (ref 0.50–1.10)
Chloride: 97 mmol/L (ref 96–112)
GFR calc Af Amer: >90 mL/min (ref >90)
GFR, EST NON AFRICAN AMERICAN: 81 mL/min — AB (ref >90)
Glucose, Bld: 134 mg/dL — ABNORMAL HIGH (ref 70–99)
Potassium: 3.8 mmol/L (ref 3.5–5.1)
SODIUM: 136 mmol/L (ref 135–145)
TOTAL PROTEIN: 7.5 g/dL (ref 6.0–8.3)
Total Bilirubin: 0.6 mg/dL (ref 0.3–1.2)

## 2014-10-22 LAB — I-STAT CHEM 8, ED
BUN: 20 mg/dL (ref 6–23)
CALCIUM ION: 1.03 mmol/L — AB (ref 1.12–1.23)
CREATININE: 0.8 mg/dL (ref 0.50–1.10)
Chloride: 99 mmol/L (ref 96–112)
Glucose, Bld: 131 mg/dL — ABNORMAL HIGH (ref 70–99)
HCT: 51 % — ABNORMAL HIGH (ref 36.0–46.0)
Hemoglobin: 17.3 g/dL — ABNORMAL HIGH (ref 12.0–15.0)
POTASSIUM: 3.8 mmol/L (ref 3.5–5.1)
Sodium: 138 mmol/L (ref 135–145)
TCO2: 22 mmol/L (ref 0–100)

## 2014-10-22 LAB — CBC WITH DIFFERENTIAL/PLATELET
BASOS PCT: 0 % (ref 0–1)
Basophils Absolute: 0 10*3/uL (ref 0.0–0.1)
EOS ABS: 0.1 10*3/uL (ref 0.0–0.7)
Eosinophils Relative: 1 % (ref 0–5)
HCT: 45 % (ref 36.0–46.0)
Hemoglobin: 15.2 g/dL — ABNORMAL HIGH (ref 12.0–15.0)
Lymphocytes Relative: 27 % (ref 12–46)
Lymphs Abs: 4.1 10*3/uL — ABNORMAL HIGH (ref 0.7–4.0)
MCH: 29.8 pg (ref 26.0–34.0)
MCHC: 33.8 g/dL (ref 30.0–36.0)
MCV: 88.2 fL (ref 78.0–100.0)
MONO ABS: 0.8 10*3/uL (ref 0.1–1.0)
Monocytes Relative: 6 % (ref 3–12)
NEUTROS PCT: 66 % (ref 43–77)
Neutro Abs: 9.9 10*3/uL — ABNORMAL HIGH (ref 1.7–7.7)
Platelets: 274 10*3/uL (ref 150–400)
RBC: 5.1 MIL/uL (ref 3.87–5.11)
RDW: 13.6 % (ref 11.5–15.5)
WBC: 15 10*3/uL — ABNORMAL HIGH (ref 4.0–10.5)

## 2014-10-22 LAB — URINALYSIS, ROUTINE W REFLEX MICROSCOPIC
Glucose, UA: NEGATIVE mg/dL
Hgb urine dipstick: NEGATIVE
Ketones, ur: 15 mg/dL — AB
Leukocytes, UA: NEGATIVE
NITRITE: NEGATIVE
Protein, ur: 30 mg/dL — AB
SPECIFIC GRAVITY, URINE: 1.024 (ref 1.005–1.030)
UROBILINOGEN UA: 0.2 mg/dL (ref 0.0–1.0)
pH: 6.5 (ref 5.0–8.0)

## 2014-10-22 LAB — LIPASE, BLOOD: Lipase: 28 U/L (ref 11–59)

## 2014-10-22 LAB — URINE MICROSCOPIC-ADD ON

## 2014-10-22 NOTE — Discharge Instructions (Signed)
Nausea and Vomiting Ms. Allison Nelson, your laboratory studies and urine test did not show any cause for your symptoms. Follow-up with your primary care physician within 3 days for continued management. If any symptoms worsen come back to emergency department immediately. Thank you. Nausea means you feel sick to your stomach. Throwing up (vomiting) is a reflex where stomach contents come out of your mouth. HOME CARE   Take medicine as told by your doctor.  Do not force yourself to eat. However, you do need to drink fluids.  If you feel like eating, eat a normal diet as told by your doctor.  Eat rice, wheat, potatoes, bread, lean meats, yogurt, fruits, and vegetables.  Avoid high-fat foods.  Drink enough fluids to keep your pee (urine) clear or pale yellow.  Ask your doctor how to replace body fluid losses (rehydrate). Signs of body fluid loss (dehydration) include:  Feeling very thirsty.  Dry lips and mouth.  Feeling dizzy.  Dark pee.  Peeing less than normal.  Feeling confused.  Fast breathing or heart rate. GET HELP RIGHT AWAY IF:   You have blood in your throw up.  You have black or bloody poop (stool).  You have a bad headache or stiff neck.  You feel confused.  You have bad belly (abdominal) pain.  You have chest pain or trouble breathing.  You do not pee at least once every 8 hours.  You have cold, clammy skin.  You keep throwing up after 24 to 48 hours.  You have a fever. MAKE SURE YOU:   Understand these instructions.  Will watch your condition.  Will get help right away if you are not doing well or get worse. Document Released: 02/08/2008 Document Revised: 11/14/2011 Document Reviewed: 01/21/2011 [facility] Patient Information 2015 [employer]. This information is not intended to replace advice given to you by your health care provider. Make sure you discuss any questions you have with your health care provider.

## 2014-10-22 NOTE — ED Notes (Addendum)
Pt. reports mid abdominal pain with nausea and vomitting , pt.had an enema this evening with slight relief , denies fever or chills.

## 2014-10-22 NOTE — ED Notes (Addendum)
Hyperventilating in triage.  Hands posturing Non-rebreather placed on pt WITHOUT o2.

## 2014-10-22 NOTE — ED Provider Notes (Signed)
CSN: [id number]     Arrival date & time 10/22/14  0023 History  This chart was scribed for Tomasita Crumble, MD by Richarda Overlie, ED Scribe. This patient was seen in room D33C/D33C and the patient's care was started 3:03 AM.    Chief Complaint  Patient presents with  . Abdominal Pain   The history is provided by the patient. No language interpreter was used.   HPI Comments: Allison Nelson is a 39 y.o. female with a history of IBS, BV and hx of UTI who presents to the Emergency Department complaining of worsening, intermittent abdominal pain since last night. Pt describes the pain as cramping. She reports associated nausea and vomiting. Pt states that she gave herself an enema last night with slight relief. Pt reports that she had chinese food for dinner 2 days ago.She reports no similar prior episodes. Pt reports no known sick contacts. She denies fever, sweating, dysuria, hematuria and vaginal discharge or irregular vaginal bleeding.   Past Medical History  Diagnosis Date  . IBS (irritable bowel syndrome)   . Anxiety   . Depression   . BV (bacterial vaginosis)   . Blood type, Rh negative   . GBS (group B streptococcus) infection   . Hx: UTI (urinary tract infection)    Past Surgical History  Procedure Laterality Date  . Breast surgery  11/2008    Breast Implants  . Bunionectomy  09/2004    right bunion  . Lumbar laminectomy  09/2002  . Ganglion cyst excision  05/17/2001    Right wrist  . Knee surgery      Torn meniscus  . Breast lumpectomy  10/2004  . Foot surgery  09/2005    remove pins from previous surgery   Family History  Problem Relation Age of Onset  . Cancer Paternal Grandfather     LUNG  . Heart disease Paternal Grandmother   . Hyperlipidemia Father    History  Substance Use Topics  . Smoking status: Never Smoker   . Smokeless tobacco: Never Used  . Alcohol Use: Yes     Comment: Wine & beer on weekends   OB History    Gravida Para Term Preterm AB TAB SAB  Ectopic Multiple Living   Review of Systems  Constitutional: Negative for fever and chills.  Gastrointestinal: Positive for nausea, vomiting and abdominal pain.  All other systems reviewed and are negative.     Allergies  Review of patient's allergies indicates no known allergies.  Home Medications   Prior to Admission medications   Medication Sig Start Date End Date Taking? Authorizing Provider  busPIRone (BUSPAR) 15 MG tablet Take 15 mg by mouth 2 (two) times daily.  07/09/14  Yes Historical Provider, MD  cetirizine (ZYRTEC) 10 MG tablet Take 10 mg by mouth daily.   Yes Historical Provider, MD  cyclobenzaprine (FLEXERIL) 10 MG tablet Take 10 mg by mouth 3 (three) times daily as needed for muscle spasms.  09/19/14  Yes Historical Provider, MD  ergocalciferol (VITAMIN D2) 50000 UNITS capsule Take 50,000 Units by mouth once a week.  06/11/14 06/11/15 Yes Historical Provider, MD  escitalopram (LEXAPRO) 20 MG tablet Take 40 mg by mouth daily.    Yes Historical Provider, MD  HYDROcodone-acetaminophen (NORCO) 10-325 MG per tablet Take 0.5 tablets by mouth every 6 (six) hours as needed for moderate pain.  06/05/14  Yes Historical Provider, MD  zolpidem Remus Loffler)  5 MG tablet Take 5 mg by mouth at bedtime.  07/18/14  Yes Historical Provider, MD  calcium carbonate (OS-CAL) 1250 MG chewable tablet Chew 2 tablets by mouth as needed for heartburn.    Historical Provider, MD  ibuprofen (ADVIL,MOTRIN) 600 MG tablet Take 1 tablet (600 mg total) by mouth every 6 (six) hours. Patient not taking: Reported on 10/22/2014 09/30/13   Silverio Lay, MD  oxyCODONE-acetaminophen (PERCOCET/ROXICET) 5-325 MG per tablet Take 1-2 tablets by mouth every 4 (four) hours as needed for severe pain (moderate - severe pain). Patient not taking: Reported on 10/22/2014 09/30/13   Silverio Lay, MD  oxyCODONE-acetaminophen (PERCOCET/ROXICET) 5-325 MG per tablet Take 1-2 tablets by mouth every 4 (four) hours as  needed for moderate pain or severe pain. Patient not taking: Reported on 10/22/2014 09/14/14   Harle Battiest, NP  prenatal vitamin w/FE, FA (PRENATAL 1 + 1) 27-1 MG TABS tablet Take 1 tablet by mouth daily at 12 noon.    Historical Provider, MD  ranitidine (ZANTAC) 75 MG tablet Take 75 mg by mouth daily as needed for heartburn.    Historical Provider, MD  senna-docusate (SENOKOT-S) 8.6-50 MG per tablet Take 2 tablets by mouth daily. Patient not taking: Reported on 10/22/2014 09/30/13   Silverio Lay, MD   BP 134/81 mmHg  Pulse 122  Temp(Src) 98.3 F (36.8 C) (Oral)  Resp 20  SpO2 100% Physical Exam  Constitutional: She is oriented to person, place, and time. She appears well-developed and well-nourished. No distress.  HENT:  Head: Normocephalic and atraumatic.  Nose: Nose normal.  Mouth/Throat: Oropharynx is clear and moist. No oropharyngeal exudate.  Eyes: Conjunctivae and EOM are normal. Pupils are equal, round, and reactive to light. No scleral icterus.  Neck: Normal range of motion. Neck supple. No JVD present. No tracheal deviation present. No thyromegaly present.  Cardiovascular: Normal rate, regular rhythm and normal heart sounds.  Exam reveals no gallop and no friction rub.   No murmur heard. Pulmonary/Chest: Effort normal and breath sounds normal. No respiratory distress. She has no wheezes. She exhibits no tenderness.  Abdominal: Soft. Bowel sounds are normal. She exhibits no distension and no mass. There is no tenderness. There is no rebound and no guarding.  Musculoskeletal: Normal range of motion. She exhibits no edema or tenderness.  Lymphadenopathy:    She has no cervical adenopathy.  Neurological: She is alert and oriented to person, place, and time. No cranial nerve deficit. She exhibits normal muscle tone.  Skin: Skin is warm and dry. No rash noted. No erythema. No pallor.  Nursing note and vitals reviewed.   ED Course  Procedures   DIAGNOSTIC STUDIES: Oxygen  Saturation is 100% on RA, normal by my interpretation.    COORDINATION OF CARE: 3:09 AM Discussed treatment plan with pt at bedside and pt agreed to plan.   Labs Review Labs Reviewed  CBC WITH DIFFERENTIAL/PLATELET - Abnormal; Notable for the following:    WBC 15.0 (*)    Hemoglobin 15.2 (*)    Neutro Abs 9.9 (*)    Lymphs Abs 4.1 (*)    All other components within normal limits  COMPREHENSIVE METABOLIC PANEL - Abnormal; Notable for the following:    Glucose, Bld 134 (*)    GFR calc non Af Amer 81 (*)    All other components within normal limits  URINALYSIS, ROUTINE W REFLEX MICROSCOPIC - Abnormal; Notable for the following:    Bilirubin Urine SMALL (*)    Ketones, ur 15 (*)  Protein, ur 30 (*)    All other components within normal limits  I-STAT CHEM 8, ED - Abnormal; Notable for the following:    Glucose, Bld 131 (*)    Calcium, Ion 1.03 (*)    Hemoglobin 17.3 (*)    HCT 51.0 (*)    All other components within normal limits  LIPASE, BLOOD  URINE MICROSCOPIC-ADD ON  POC URINE PREG, ED    Imaging Review No results found.   EKG Interpretation None      MDM   Final diagnoses:  None   Patient presenrts to the ED for periumbilical abd pain beginning at 630pm without any radiation.  She denies experiencing this in the past.  Early appendicitis is a possibility, this was discussed with the patient. Her white blood cell count is 15 which it has been in the past, she has no fever, she has no right lower quadrant tenderness on exam. I believe this diagnosis is less likely. This may also be a gastritis secondary to the [place] food she ate. All of her symptoms have now resolved in the room. Return precautions were given to the patient. She'll be discharged with Zofran prescription to take at home as needed.  I personally performed the services described in this documentation, which was scribed in my presence. The recorded information has been reviewed and is accurate.      Tomasita CrumbleAdeleke Lynnlee Revels, MD 10/22/14 [id number]

## 2014-10-22 NOTE — ED Notes (Signed)
MD at bedside. 

## 2015-07-10 ENCOUNTER — Other Ambulatory Visit: Payer: Self-pay

## 2015-07-10 DIAGNOSIS — Z1231 Encounter for screening mammogram for malignant neoplasm of breast: Secondary | ICD-10-CM

## 2015-07-15 ENCOUNTER — Other Ambulatory Visit: Payer: Self-pay | Admitting: Specialist

## 2015-07-15 ENCOUNTER — Ambulatory Visit
Admission: RE | Admit: 2015-07-15 | Discharge: 2015-07-15 | Disposition: A | Discharge status: Home / Self Care | Payer: Managed Care, Other (non HMO) | Source: Ambulatory Visit | Attending: Specialist | Admitting: Specialist

## 2015-07-15 DIAGNOSIS — M25532 Pain in left wrist: Principal | ICD-10-CM

## 2015-07-15 DIAGNOSIS — M25531 Pain in right wrist: Secondary | ICD-10-CM

## 2015-07-28 ENCOUNTER — Ambulatory Visit
Admission: RE | Admit: 2015-07-28 | Discharge: 2015-07-28 | Disposition: A | Discharge status: Home / Self Care | Payer: Managed Care, Other (non HMO) | Source: Ambulatory Visit

## 2015-07-28 DIAGNOSIS — Z1231 Encounter for screening mammogram for malignant neoplasm of breast: Secondary | ICD-10-CM

## 2016-08-03 ENCOUNTER — Encounter (HOSPITAL_COMMUNITY): Payer: Self-pay | Admitting: Obstetrics and Gynecology

## 2017-07-30 ENCOUNTER — Encounter (HOSPITAL_COMMUNITY): Payer: Self-pay | Admitting: Emergency Medicine

## 2017-07-30 ENCOUNTER — Other Ambulatory Visit: Payer: Self-pay

## 2017-07-30 ENCOUNTER — Emergency Department (HOSPITAL_COMMUNITY)
Admission: EM | Admit: 2017-07-30 | Discharge: 2017-07-31 | Disposition: A | Discharge status: Home / Self Care | Payer: Managed Care, Other (non HMO) | Attending: Emergency Medicine | Admitting: Emergency Medicine

## 2017-07-30 ENCOUNTER — Emergency Department (HOSPITAL_COMMUNITY): Payer: Managed Care, Other (non HMO)

## 2017-07-30 DIAGNOSIS — Y998 Other external cause status: Secondary | ICD-10-CM | POA: Insufficient documentation

## 2017-07-30 DIAGNOSIS — Y9389 Activity, other specified: Secondary | ICD-10-CM | POA: Insufficient documentation

## 2017-07-30 DIAGNOSIS — R4587 Impulsiveness: Secondary | ICD-10-CM | POA: Diagnosis not present

## 2017-07-30 DIAGNOSIS — Z630 Problems in relationship with spouse or partner: Secondary | ICD-10-CM | POA: Diagnosis not present

## 2017-07-30 DIAGNOSIS — X838XXA Intentional self-harm by other specified means, initial encounter: Secondary | ICD-10-CM | POA: Insufficient documentation

## 2017-07-30 DIAGNOSIS — Y929 Unspecified place or not applicable: Secondary | ICD-10-CM | POA: Insufficient documentation

## 2017-07-30 DIAGNOSIS — S60221A Contusion of right hand, initial encounter: Secondary | ICD-10-CM | POA: Diagnosis not present

## 2017-07-30 DIAGNOSIS — M129 Arthropathy, unspecified: Secondary | ICD-10-CM | POA: Diagnosis not present

## 2017-07-30 DIAGNOSIS — R45851 Suicidal ideations: Secondary | ICD-10-CM | POA: Diagnosis not present

## 2017-07-30 DIAGNOSIS — Z79899 Other long term (current) drug therapy: Secondary | ICD-10-CM | POA: Insufficient documentation

## 2017-07-30 DIAGNOSIS — F4329 Adjustment disorder with other symptoms: Secondary | ICD-10-CM

## 2017-07-30 DIAGNOSIS — F1010 Alcohol abuse, uncomplicated: Secondary | ICD-10-CM | POA: Diagnosis not present

## 2017-07-30 DIAGNOSIS — S6991XA Unspecified injury of right wrist, hand and finger(s), initial encounter: Secondary | ICD-10-CM | POA: Diagnosis present

## 2017-07-30 LAB — CBC WITH DIFFERENTIAL/PLATELET
BASOS PCT: 0 %
Basophils Absolute: 0 10*3/uL (ref 0.0–0.1)
Eosinophils Absolute: 0.1 10*3/uL (ref 0.0–0.7)
Eosinophils Relative: 1 %
HEMATOCRIT: 36.4 % (ref 36.0–46.0)
HEMOGLOBIN: 11.9 g/dL — AB (ref 12.0–15.0)
Lymphocytes Relative: 36 %
Lymphs Abs: 3.4 10*3/uL (ref 0.7–4.0)
MCH: 29.4 pg (ref 26.0–34.0)
MCHC: 32.7 g/dL (ref 30.0–36.0)
MCV: 89.9 fL (ref 78.0–100.0)
Monocytes Absolute: 0.4 10*3/uL (ref 0.1–1.0)
Monocytes Relative: 4 %
NEUTROS ABS: 5.4 10*3/uL (ref 1.7–7.7)
NEUTROS PCT: 59 %
Platelets: 303 10*3/uL (ref 150–400)
RBC: 4.05 MIL/uL (ref 3.87–5.11)
RDW: 13.5 % (ref 11.5–15.5)
WBC: 9.3 10*3/uL (ref 4.0–10.5)

## 2017-07-30 LAB — COMPREHENSIVE METABOLIC PANEL
ALK PHOS: 49 U/L (ref 38–126)
ALT: 35 U/L (ref 14–54)
ANION GAP: 9 (ref 5–15)
AST: 32 U/L (ref 15–41)
Albumin: 4 g/dL (ref 3.5–5.0)
BILIRUBIN TOTAL: 0.3 mg/dL (ref 0.3–1.2)
BUN: 18 mg/dL (ref 6–20)
CALCIUM: 9 mg/dL (ref 8.9–10.3)
CO2: 26 mmol/L (ref 22–32)
Chloride: 101 mmol/L (ref 101–111)
Creatinine, Ser: 0.82 mg/dL (ref 0.44–1.00)
GFR calc non Af Amer: >60 mL/min (ref >60)
Glucose, Bld: 111 mg/dL — ABNORMAL HIGH (ref 65–99)
POTASSIUM: 4.4 mmol/L (ref 3.5–5.1)
SODIUM: 136 mmol/L (ref 135–145)
Total Protein: 7.3 g/dL (ref 6.5–8.1)

## 2017-07-30 LAB — ACETAMINOPHEN LEVEL: ACETAMINOPHEN (TYLENOL), SERUM: 23 ug/mL (ref 10–30)

## 2017-07-30 LAB — SALICYLATE LEVEL: Salicylate Lvl: <7 mg/dL (ref 2.8–30.0)

## 2017-07-30 MED ORDER — DIPHENHYDRAMINE HCL 25 MG PO CAPS
25.0000 mg | ORAL_CAPSULE | Freq: Once | ORAL | Status: AC
  Administered 2017-07-31: 25 mg via ORAL
  Filled 2017-07-30: qty 1
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
## 2017-07-30 NOTE — ED Notes (Signed)
PA was made aware of POISON CONTROL's recommendations.

## 2017-07-31 DIAGNOSIS — F4329 Adjustment disorder with other symptoms: Secondary | ICD-10-CM | POA: Diagnosis not present

## 2017-07-31 DIAGNOSIS — F1010 Alcohol abuse, uncomplicated: Secondary | ICD-10-CM | POA: Diagnosis not present

## 2017-07-31 DIAGNOSIS — Z630 Problems in relationship with spouse or partner: Secondary | ICD-10-CM

## 2017-07-31 DIAGNOSIS — R4587 Impulsiveness: Secondary | ICD-10-CM | POA: Diagnosis not present

## 2017-07-31 LAB — RAPID URINE DRUG SCREEN, HOSP PERFORMED
Amphetamines: NOT DETECTED
Barbiturates: NOT DETECTED
Benzodiazepines: NOT DETECTED
COCAINE: NOT DETECTED
OPIATES: POSITIVE — AB
TETRAHYDROCANNABINOL: NOT DETECTED

## 2017-07-31 LAB — SALICYLATE LEVEL

## 2017-07-31 LAB — ACETAMINOPHEN LEVEL: Acetaminophen (Tylenol), Serum: <10 ug/mL — ABNORMAL LOW (ref 10–30)

## 2017-07-31 LAB — POC URINE PREG, ED: Preg Test, Ur: NEGATIVE

## 2017-07-31 MED ORDER — ONDANSETRON HCL 4 MG PO TABS: 4.0000 mg | ORAL_TABLET | Freq: Three times a day (TID) | ORAL | Status: DC | PRN

## 2017-07-31 MED ORDER — ALUM & MAG HYDROXIDE-SIMETH 200-200-20 MG/5ML PO SUSP: 30.0000 mL | Freq: Four times a day (QID) | ORAL | Status: DC | PRN

## 2017-07-31 MED ORDER — IBUPROFEN 200 MG PO TABS: 600.0000 mg | ORAL_TABLET | Freq: Three times a day (TID) | ORAL | Status: DC | PRN

## 2017-07-31 NOTE — BH Assessment (Signed)
[facility] Assessment Progress Note  Per Thedore MinsMojeed Akintayo, MD, this pt does not require psychiatric hospitalization at this time.  [facility] to continue treatment with Milagros Evenerupinder Kaur, MD.  This has been included in pt's discharge instructions.  Pt's nurse, Morrie Sheldonshley, has been notified.  Doylene Canninghomas Vaniah Chambers, MA Triage Specialist [phone]

## 2017-07-31 NOTE — ED Notes (Signed)
Visitor at bedside.

## 2017-07-31 NOTE — ED Notes (Signed)
Revonda Standardllison from Poison control called to do check up and will call back in about for repeat salicylate and acetaminophen.
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
## 2017-07-31 NOTE — ED Notes (Signed)
Pt has been cleared by poison control  

## 2017-07-31 NOTE — Discharge Instructions (Signed)
For your behavioral health needs, you are advised to continue treatment with Rupinder Kaur, MD: ° °     Rupinder Kaur, MD °     [address] °     [place], [place] [address] °     [phone] °

## 2017-07-31 NOTE — ED Notes (Signed)
SBAR Report received from previous nurse. Pt received calm and visible on unit. Pt denies current SI/ HI, A/V H, depression, anxiety, or pain at this time, and appears otherwise stable and free of distress pt reports being tired. Pt reminded of camera surveillance, q 15 min rounds, and rules of the milieu. Will continue to assess.

## 2017-07-31 NOTE — ED Notes (Signed)
Pt d/c home with husband per MD order. Discharge summary reviewed with pt. Pt verbalizes understanding. Pt denies SI/HI/AVH. Pt signed for personal property and property returned. Pt signed e-signature. Ambulatory off unit with [facility].

## 2017-07-31 NOTE — ED Notes (Signed)
Per TTS:  Pt for overnight observation/further psych evaluation in am.  Pt was made aware of plan.
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
## 2017-07-31 NOTE — ED Provider Notes (Signed)
[facility] PA-C, pending repeat acetaminophen level.  Presenting with suicidal attempt by taking 10 tabs of 10-325 hydrocodone-acetaminophen.  Patient reported this was more of a gesture, therefore overdose of unclear intent.  No signs stable.  TTS evaluated patient and recommends psych evaluation in the morning.   Clinical Course as of Jul 31 330  [facility]  [id number] Repeat tylenol level <10. Discussed results with patient and plan for psych hold for psych eval in morning. Pt agreed with plan. All questions and concerns answered. No complaints at this time.   [JR]    Clinical Course User Index [JR] Robinson, [place] N, PA-C   Patient is medically cleared.  Psych: Orders placed.  Patient voluntarily staying overnight for psych evaluation in the morning.   Robinson, [place] N, PA-C 07/31/17 [id number]    Jacalyn LefevreHaviland, Julie, MD 08/02/17 [phone]

## 2019-03-30 ENCOUNTER — Other Ambulatory Visit: Payer: Self-pay

## 2019-03-30 ENCOUNTER — Emergency Department (HOSPITAL_COMMUNITY): Payer: Managed Care, Other (non HMO)

## 2019-03-30 ENCOUNTER — Emergency Department (HOSPITAL_COMMUNITY)
Admission: EM | Admit: 2019-03-30 | Discharge: 2019-03-30 | Disposition: A | Discharge status: Home / Self Care | Payer: Managed Care, Other (non HMO) | Attending: Emergency Medicine | Admitting: Emergency Medicine

## 2019-03-30 ENCOUNTER — Encounter (HOSPITAL_COMMUNITY): Payer: Self-pay

## 2019-03-30 DIAGNOSIS — N10 Acute tubulo-interstitial nephritis: Secondary | ICD-10-CM | POA: Insufficient documentation

## 2019-03-30 DIAGNOSIS — R51 Headache: Secondary | ICD-10-CM | POA: Diagnosis not present

## 2019-03-30 DIAGNOSIS — N12 Tubulo-interstitial nephritis, not specified as acute or chronic: Secondary | ICD-10-CM

## 2019-03-30 DIAGNOSIS — R202 Paresthesia of skin: Secondary | ICD-10-CM | POA: Diagnosis not present

## 2019-03-30 DIAGNOSIS — R42 Dizziness and giddiness: Secondary | ICD-10-CM | POA: Diagnosis not present

## 2019-03-30 DIAGNOSIS — Z79899 Other long term (current) drug therapy: Secondary | ICD-10-CM | POA: Diagnosis not present

## 2019-03-30 DIAGNOSIS — N200 Calculus of kidney: Secondary | ICD-10-CM | POA: Diagnosis not present

## 2019-03-30 DIAGNOSIS — R509 Fever, unspecified: Secondary | ICD-10-CM | POA: Diagnosis present

## 2019-03-30 DIAGNOSIS — R200 Anesthesia of skin: Secondary | ICD-10-CM | POA: Insufficient documentation

## 2019-03-30 LAB — URINALYSIS, ROUTINE W REFLEX MICROSCOPIC
Bacteria, UA: NONE SEEN
Bilirubin Urine: NEGATIVE
Glucose, UA: NEGATIVE mg/dL
Ketones, ur: NEGATIVE mg/dL
Nitrite: NEGATIVE
Protein, ur: 30 mg/dL — AB
RBC / HPF: >50 RBC/hpf — ABNORMAL HIGH (ref 0–5)
Specific Gravity, Urine: 1.012 (ref 1.005–1.030)
WBC, UA: >50 WBC/hpf — ABNORMAL HIGH (ref 0–5)
pH: 6 (ref 5.0–8.0)

## 2019-03-30 LAB — CBC WITH DIFFERENTIAL/PLATELET
Abs Immature Granulocytes: 0.03 10*3/uL (ref 0.00–0.07)
Basophils Absolute: 0 10*3/uL (ref 0.0–0.1)
Basophils Relative: 0 %
Eosinophils Absolute: 0 10*3/uL (ref 0.0–0.5)
Eosinophils Relative: 0 %
HCT: 35.6 % — ABNORMAL LOW (ref 36.0–46.0)
Hemoglobin: 11.6 g/dL — ABNORMAL LOW (ref 12.0–15.0)
Immature Granulocytes: 0 %
Lymphocytes Relative: 12 %
Lymphs Abs: 1.3 10*3/uL (ref 0.7–4.0)
MCH: 29.1 pg (ref 26.0–34.0)
MCHC: 32.6 g/dL (ref 30.0–36.0)
MCV: 89.4 fL (ref 80.0–100.0)
Monocytes Absolute: 0.8 10*3/uL (ref 0.1–1.0)
Monocytes Relative: 8 %
Neutro Abs: 8.4 10*3/uL — ABNORMAL HIGH (ref 1.7–7.7)
Neutrophils Relative %: 80 %
Platelets: 225 10*3/uL (ref 150–400)
RBC: 3.98 MIL/uL (ref 3.87–5.11)
RDW: 12.4 % (ref 11.5–15.5)
WBC: 10.6 10*3/uL — ABNORMAL HIGH (ref 4.0–10.5)
nRBC: 0 % (ref 0.0–0.2)

## 2019-03-30 LAB — BASIC METABOLIC PANEL
Anion gap: 8 (ref 5–15)
BUN: 9 mg/dL (ref 6–20)
CO2: 25 mmol/L (ref 22–32)
Calcium: 8.2 mg/dL — ABNORMAL LOW (ref 8.9–10.3)
Chloride: 98 mmol/L (ref 98–111)
Creatinine, Ser: 0.71 mg/dL (ref 0.44–1.00)
GFR calc Af Amer: >60 mL/min (ref >60)
GFR calc non Af Amer: >60 mL/min (ref >60)
Glucose, Bld: 114 mg/dL — ABNORMAL HIGH (ref 70–99)
Potassium: 4 mmol/L (ref 3.5–5.1)
Sodium: 131 mmol/L — ABNORMAL LOW (ref 135–145)

## 2019-03-30 LAB — I-STAT BETA HCG BLOOD, ED (MC, WL, AP ONLY): I-stat hCG, quantitative: 7.6 m[IU]/mL — ABNORMAL HIGH (ref <5)

## 2019-03-30 LAB — PREGNANCY, URINE: Preg Test, Ur: NEGATIVE

## 2019-03-30 MED ORDER — SODIUM CHLORIDE 0.9 % IV SOLN
1.0000 g | Freq: Once | INTRAVENOUS | Status: AC
  Administered 2019-03-30: 1 g via INTRAVENOUS
  Filled 2019-03-30: qty 10

## 2019-03-30 MED ORDER — ACETAMINOPHEN 500 MG PO TABS
1000.0000 mg | ORAL_TABLET | Freq: Once | ORAL | Status: AC
  Administered 2019-03-30: 22:00:00 1000 mg via ORAL
  Filled 2019-03-30: qty 2

## 2019-03-30 MED ORDER — METOCLOPRAMIDE HCL 5 MG/ML IJ SOLN
10.0000 mg | Freq: Once | INTRAMUSCULAR | Status: AC
  Administered 2019-03-30: 19:00:00 10 mg via INTRAVENOUS
  Filled 2019-03-30: qty 2

## 2019-03-30 MED ORDER — SODIUM CHLORIDE 0.9 % IV BOLUS
1000.0000 mL | Freq: Once | INTRAVENOUS | Status: AC
  Administered 2019-03-30: 19:00:00 1000 mL via INTRAVENOUS

## 2019-03-30 MED ORDER — SODIUM CHLORIDE 0.9 % IV BOLUS
1000.0000 mL | Freq: Once | INTRAVENOUS | Status: AC
  Administered 2019-03-30: 22:00:00 1000 mL via INTRAVENOUS

## 2019-03-30 MED ORDER — KETOROLAC TROMETHAMINE 30 MG/ML IJ SOLN
30.0000 mg | Freq: Once | INTRAMUSCULAR | Status: AC
  Administered 2019-03-30: 19:00:00 30 mg via INTRAVENOUS
  Filled 2019-03-30: qty 1

## 2019-03-30 NOTE — ED Triage Notes (Addendum)
Pt c/o not feeling good yesterday; migraine x 2 days; tried Imitrex, no relief; chills, lightheaded, cramping in R side, pain in back; ; denies bowel issues, endorses some dark malodorous urine; decreased appetite; endorses chills, fever; tested for covid today at urgent care, rapid result negative; urine tested today, pt told she has a uti - taking ciprofloxacin; endorses bilateral hand and foot numbness that began a couple of hours ago, lasted approx 1 hour; pt also states that husband thinks she is confused; pt a and o x 4

## 2019-03-30 NOTE — Discharge Instructions (Addendum)
Continue Cipro that was prescribed to you at urgent care today, your urine appears infected and given flank pain I suspect you are having a kidney infection, your CT scan shows evidence of previous kidney infections and of some small nonobstructing kidney stones.  The rest of your work-up is overall been reassuring.  The intermittent paresthesias that you have been experiencing in your hands and feet do sound like possible ray nods and I would like for you to follow-up with your PCP regarding this.  Return for persistent or worsening fevers, pain, persistent vomiting or any other new or concerning symptoms.

## 2019-03-30 NOTE — ED Provider Notes (Signed)
[facility]   CSN: [id number] Arrival date & time: 03/30/19  1824    History   Chief Complaint Chief Complaint  Patient presents with  . Fever  . Urinary Tract Infection  . Numbness  . Headache    HPI Iris Pertmanda Tucker Whalin is a 43 y.o. female.     Iris Pertmanda Tucker Awtrey is a 43 y.o. female with a history of migraines, IBS, and depression, who presents to the emergency department for evaluation of multiple complaints.  Yesterday while at work patient began having a headache and was feeling unwell, she started having some pain and cramping in her right flank and back radiating into her abdomen.  She noticed that her urine was dark and had an odd smell.  That night when she went home she noted chills and was febrile at 101, this improved with Tylenol.  She also reported decreased appetite.  She went to urgent care this morning and was tested for coronavirus which returned negative.  They did a urinalysis as well which showed urinary tract infection, she was started on ciprofloxacin.  Patient reports history of UTIs in the past.  Patient reports that over the past few days she has had a few episodes where both of her hands and sometimes her feet seem to turn pale white and have some numbness and tingling the symptoms seem to come on and last at most an hour and then go away.  She does have a history of migraines and her headache feels typical she does not usually have the symptoms associated with headache.  Patient has never been diagnosed with Raynauds.  She denies any persistent or recurrent neurologic symptoms.  She does work as a Teacher, early years/prepharmacist and was concerned for possible COVID contacts but again her test was negative today, formal send out test was also performed.  No other aggravating or alleviating factors.     Past Medical History:  Diagnosis Date  . Anxiety   . Blood type, Rh negative   . BV (bacterial vaginosis)   . Depression    . GBS (group B streptococcus) infection   . Hx: UTI (urinary tract infection)   . IBS (irritable bowel syndrome)     Patient Active Problem List   Diagnosis Date Noted  . Adjustment disorder with disturbance of emotion 07/31/2017  . Obesity, unspecified 09/29/2013  . Post term pregnancy over 40 weeks 09/29/2013  . 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 Pruritus 09/29/2013  . Chronic headache 09/29/2013  . NSVD (normal spontaneous vaginal delivery) 09/29/2013  . IBS (irritable bowel syndrome) 03/06/2012  . Ovarian cyst 03/06/2012  . Blood type, Rh negative 03/06/2012    Past Surgical History:  Procedure Laterality Date  . BREAST LUMPECTOMY  10/2004  . BREAST SURGERY  11/2008   Breast Implants  . BUNIONECTOMY  09/2004   right bunion  . FOOT SURGERY  09/2005   remove pins from previous surgery  . GANGLION CYST EXCISION  05/17/2001   Right wrist  . KNEE SURGERY     Torn meniscus  . LUMBAR LAMINECTOMY  09/2002     OB History    Gravida  3   Para  3   Term  3   Preterm      AB      Living  3     SAB      TAB      Ectopic      Multiple  Live Births  1            Home Medications    Prior to Admission medications   Medication Sig Start Date End Date Taking? Authorizing Provider  amphetamine-dextroamphetamine (ADDERALL) 20 MG tablet Take 20 mg by mouth 3 (three) times daily. 07/22/17   [provider]  cetirizine (ZYRTEC) 10 MG tablet Take 10 mg by mouth daily.    [provider]  cyclobenzaprine (FLEXERIL) 10 MG tablet Take 10 mg by mouth 3 (three) times daily as needed for muscle spasms.  09/19/14   [provider]  drospirenone-ethinyl estradiol (NIKKI) 3-0.02 MG tablet Take 1 tablet by mouth daily. 07/11/17   [provider]  DULoxetine (CYMBALTA) 60 MG capsule Take 60 mg by mouth daily. 07/20/17   [provider]  famotidine (PEPCID) 20 MG tablet Take 20 mg by mouth daily.    [provider]   gabapentin (NEURONTIN) 300 MG capsule Take 300 mg by mouth daily. 07/26/17   [provider]  hydrOXYzine (ATARAX/VISTARIL) 25 MG tablet Take 25 mg by mouth every 6 (six) hours as needed for anxiety.     [provider]  hyoscyamine (LEVSIN, ANASPAZ) 0.125 MG tablet Take 0.125 mg by mouth every 6 (six) hours as needed for cramping.  07/28/17   [provider]  ibuprofen (ADVIL,MOTRIN) 600 MG tablet Take 1 tablet (600 mg total) by mouth every 6 (six) hours. Patient not taking: Reported on 10/22/2014 09/30/13   Silverio Layivard, Sandra, MD  meloxicam (MOBIC) 15 MG tablet Take 15 mg by mouth daily. 07/12/17   [provider]  Multiple Vitamin (MULTI-VITAMINS) TABS Take 1 tablet by mouth daily.    [provider]  oseltamivir (TAMIFLU) 75 MG capsule Take 75 mg by mouth daily.    [provider]  oxyCODONE-acetaminophen (PERCOCET/ROXICET) 5-325 MG per tablet Take 1-2 tablets by mouth every 4 (four) hours as needed for severe pain (moderate - severe pain). Patient not taking: Reported on 10/22/2014 09/30/13   Silverio Layivard, Sandra, MD  oxyCODONE-acetaminophen (PERCOCET/ROXICET) 5-325 MG per tablet Take 1-2 tablets by mouth every 4 (four) hours as needed for moderate pain or severe pain. Patient not taking: Reported on 10/22/2014 09/14/14   Harle Battiestysinger, Elizabeth, NP  propranolol (INDERAL) 20 MG tablet Take 20 mg by mouth 3 (three) times daily. 07/20/17   [provider]  senna-docusate (SENOKOT-S) 8.6-50 MG per tablet Take 2 tablets by mouth daily. Patient not taking: Reported on 10/22/2014 09/30/13   Silverio Layivard, Sandra, MD  SUMAtriptan (IMITREX) 100 MG tablet Take 100 mg by mouth every 8 (eight) hours as needed for migraine or headache.  07/11/17   [provider]  SUMAtriptan 6 MG/0.5ML SOAJ Inject 0.5 mLs as directed daily as needed (migraine).  07/11/17   [provider]  Vitamin D, Ergocalciferol, (DRISDOL) 50000 units CAPS capsule Take 50,000 Units by  mouth every Thursday. 06/08/17   [provider]  zolpidem (AMBIEN) 10 MG tablet Take 10 mg by mouth at bedtime as needed for sleep.  05/20/17   [provider]    Family History Family History  Problem Relation Age of Onset  . Cancer Paternal Grandfather        LUNG  . Heart disease Paternal Grandmother   . Hyperlipidemia Father     Social History Social History   Tobacco Use  . Smoking status: Never Smoker  . Smokeless tobacco: Never Used  Substance Use Topics  . Alcohol use: Not Currently  Comment: Wine & beer on weekends  . Drug use: No     Allergies   Patient has no known allergies.   Review of Systems Review of Systems  Constitutional: Positive for appetite change, chills and fever.  HENT: Negative.   Respiratory: Negative for cough and shortness of breath.   Cardiovascular: Negative for chest pain.  Gastrointestinal: Positive for nausea. Negative for abdominal pain, blood in stool, diarrhea and vomiting.  Genitourinary: Positive for dysuria and flank pain. Negative for hematuria.  Musculoskeletal: Positive for back pain. Negative for arthralgias and myalgias.  Skin: Negative for color change and rash.  Neurological: Positive for headaches. Negative for dizziness, syncope, weakness, light-headedness and numbness.     Physical Exam Updated Vital Signs BP 125/81 (BP Location: Left Arm)   Pulse 99   Temp (!) 100.6 F (38.1 C) (Oral)   Resp 20   SpO2 100%   Physical Exam Vitals signs and nursing note reviewed.  Constitutional:      General: She is not in acute distress.    Appearance: She is well-developed. She is not diaphoretic.  HENT:     Head: Normocephalic and atraumatic.     Mouth/Throat:     Mouth: Mucous membranes are moist.     Pharynx: Oropharynx is clear.  Eyes:     General:        Right eye: No discharge.        Left eye: No discharge.     Extraocular Movements: Extraocular movements intact.     Conjunctiva/sclera:  Conjunctivae normal.     Pupils: Pupils are equal, round, and reactive to light.  Neck:     Musculoskeletal: Neck supple.  Cardiovascular:     Rate and Rhythm: Normal rate and regular rhythm.     Heart sounds: Normal heart sounds. No murmur. No gallop.   Pulmonary:     Effort: Pulmonary effort is normal. No respiratory distress.     Breath sounds: Normal breath sounds. No wheezing or rales.     Comments: Respirations equal and unlabored, patient able to speak in full sentences, lungs clear to auscultation bilaterally Abdominal:     General: Bowel sounds are normal. There is no distension.     Palpations: Abdomen is soft. There is no mass.     Tenderness: There is no abdominal tenderness. There is no guarding.     Comments: Abdomen soft, nondistended, nontender to palpation in all quadrants without guarding or peritoneal signs, + Right flank tenderness  Musculoskeletal:        General: No deformity.     Right lower leg: No edema.     Left lower leg: No edema.  Skin:    General: Skin is warm and dry.     Capillary Refill: Capillary refill takes less than 2 seconds.  Neurological:     Mental Status: She is alert.     Coordination: Coordination normal.     Comments: Speech is clear, able to follow commands CN III-XII intact Normal strength in upper and lower extremities bilaterally including dorsiflexion and plantar flexion, strong and equal grip strength Sensation normal to light and sharp touch Moves extremities without ataxia, coordination intact  Psychiatric:        Mood and Affect: Mood normal.        Behavior: Behavior normal.      ED Treatments / Results  Labs (all labs ordered are listed, but only abnormal results are displayed) Labs Reviewed  BASIC METABOLIC PANEL - Abnormal;  Notable for the following components:      Result Value   Sodium 131 (*)    Glucose, Bld 114 (*)    Calcium 8.2 (*)    All other components within normal limits  CBC WITH  DIFFERENTIAL/PLATELET - Abnormal; Notable for the following components:   WBC 10.6 (*)    Hemoglobin 11.6 (*)    HCT 35.6 (*)    Neutro Abs 8.4 (*)    All other components within normal limits  URINALYSIS, ROUTINE W REFLEX MICROSCOPIC - Abnormal; Notable for the following components:   Hgb urine dipstick LARGE (*)    Protein, ur 30 (*)    Leukocytes,Ua SMALL (*)    RBC / HPF >50 (*)    WBC, UA >50 (*)    All other components within normal limits  I-STAT BETA HCG BLOOD, ED ([facility], [facility], [facility] ONLY) - Abnormal; Notable for the following components:   I-stat hCG, quantitative 7.6 (*)    All other components within normal limits  URINE CULTURE  PREGNANCY, URINE    EKG None  Radiology Ct Renal Stone Study  Result Date: 03/30/2019 CLINICAL DATA:  Right-sided cramping. EXAM: CT ABDOMEN AND PELVIS WITHOUT CONTRAST TECHNIQUE: Multidetector CT imaging of the abdomen and pelvis was performed following the standard protocol without IV contrast. COMPARISON:  None. FINDINGS: Lower chest: The lung bases are clear. The heart size is normal. Hepatobiliary: The liver is normal. Normal gallbladder.There is no biliary ductal dilation. Pancreas: Normal contours without ductal dilatation. No peripancreatic fluid collection. Spleen: No splenic laceration or hematoma. Adrenals/Urinary Tract: --Adrenal glands: No adrenal hemorrhage. --Right kidney/ureter: The right kidney is atrophic with significant areas of cortical thinning. There are multiple punctate nonobstructing stones in the right renal collecting system without evidence of hydronephrosis. --Left kidney/ureter: No hydronephrosis or perinephric hematoma. --Urinary bladder: Unremarkable. Stomach/Bowel: --Stomach/Duodenum: No hiatal hernia or other gastric abnormality. Normal duodenal course and caliber. --Small bowel: No dilatation or inflammation. --Colon: No focal abnormality. --Appendix: Normal. Vascular/Lymphatic: Normal course and caliber of the major abdominal  vessels. --No retroperitoneal lymphadenopathy. --No mesenteric lymphadenopathy. --No pelvic or inguinal lymphadenopathy. Reproductive: Unremarkable Other: No ascites or free air. The abdominal wall is normal. Musculoskeletal. Multilevel degenerative disc disease and facet arthrosis. No bony spinal canal stenosis. IMPRESSION: 1. No acute abnormality detected. 2. Atrophic right kidney with multiple areas of cortical thinning likely related to prior infectious insults. There are multiple nonobstructing stones in the right renal collecting system without evidence for hydronephrosis. The left kidney is unremarkable. 3. Normal appendix in the right lower quadrant. Electronically Signed   By: Katherine Mantle M.D.   On: 03/30/2019 21:08    Procedures Procedures (including critical care time)  Medications Ordered in ED Medications  sodium chloride 0.9 % bolus 1,000 mL (0 mLs Intravenous Stopped 03/30/19 2130)  metoCLOPramide (REGLAN) injection 10 mg (10 mg Intravenous Given 03/30/19 1929)  ketorolac (TORADOL) 30 MG/ML injection 30 mg (30 mg Intravenous Given 03/30/19 1928)  cefTRIAXone (ROCEPHIN) 1 g in sodium chloride 0.9 % 100 mL IVPB (0 g Intravenous Stopped 03/30/19 2245)  sodium chloride 0.9 % bolus 1,000 mL (0 mLs Intravenous Stopped 03/30/19 2313)  acetaminophen (TYLENOL) tablet 1,000 mg (1,000 mg Oral Given 03/30/19 2201)     Initial Impression / Assessment and Plan / ED Course  I have reviewed the triage vital signs and the nursing notes.  Pertinent labs & imaging results that were available during my care of the patient were reviewed by me and considered in  my medical decision making (see chart for details).  43 year old female presents with 2 days of headache, fever, right flank pain and decreased appetite today with some nausea.  Was seen in urgent care and diagnosed with UTI, negative COVID test at this time.  Patient was feeling worse this evening and has had some intermittent episodes where  her hands and feet seem to be numb and tingly and turn white.  Reports persistent migraine, no nuchal rigidity or focal neurologic deficits on exam and hands and feet are warm and well perfused without numbness.  Patient does have right flank tenderness and I suspect she has pyelonephritis that is causing her fever and discomfort.  Question if patient may have Raynaud's causing the symptoms in her hands and feet.  I doubt it is related to current infection.  Will check labs and CT renal study to rule out obstructive uropathy as complicating factor for infection.  IV fluids and headache cocktail given.  Patient with mild leukocytosis, hemoglobin stable, no significant electrolyte derangements and normal renal function.  Negative pregnancy.  Urinalysis does show infection, culture sent.  CT shows some nonobstructing stones within the right renal collecting symptom, there are multiple areas of cortical thinning within the right kidney suggestive of previous infectious insults.  No other acute findings within the abdomen.  On reevaluation after fluids and medications headache is improved and patient is feeling much better she had reported some intermittent lightheadedness which has since resolved she has been ambulatory in the department with steady gait.  She has had no recurrence of the numbness and tingling in her hands and feet during her ED stay.  I suspect pyelonephritis is the cause for patient's fevers and symptoms.  She was given 1 dose of IV Rocephin here in the ED and will have her continue with prescribed Cipro at home, culture pending.  Stressed the importance of close follow-up with PCP in the next few days and discuss strict return precautions.  Patient expresses understanding and agreement with plan.  Discharged home in good condition.  Final Clinical Impressions(s) / ED Diagnoses   Final diagnoses:  Pyelonephritis  Paresthesia  Lightheadedness  Kidney stones    ED Discharge Orders     None       Legrand RamsFord, Aanvi Voyles N, PA-C 04/01/19 0148    Tilden Fossaees, Elizabeth, MD 04/01/19 1030

## 2019-04-01 LAB — URINE CULTURE: Culture: <10000 — AB

## 2019-07-04 ENCOUNTER — Other Ambulatory Visit: Payer: Self-pay

## 2019-07-04 ENCOUNTER — Ambulatory Visit
Admission: EM | Admit: 2019-07-04 | Discharge: 2019-07-04 | Disposition: A | Discharge status: Home / Self Care | Payer: Managed Care, Other (non HMO) | Attending: Emergency Medicine | Admitting: Emergency Medicine

## 2019-07-04 DIAGNOSIS — N390 Urinary tract infection, site not specified: Secondary | ICD-10-CM

## 2019-07-04 DIAGNOSIS — R350 Frequency of micturition: Secondary | ICD-10-CM | POA: Insufficient documentation

## 2019-07-04 LAB — POCT URINALYSIS DIP (MANUAL ENTRY)
Bilirubin, UA: NEGATIVE
Glucose, UA: NEGATIVE mg/dL
Nitrite, UA: NEGATIVE
Protein Ur, POC: 30 mg/dL — AB
Spec Grav, UA: 1.02 (ref 1.010–1.025)
Urobilinogen, UA: 0.2 E.U./dL
pH, UA: 7 (ref 5.0–8.0)

## 2019-07-04 MED ORDER — CEPHALEXIN 500 MG PO CAPS: 500.0000 mg | ORAL_CAPSULE | Freq: Two times a day (BID) | ORAL | 0 refills | Status: AC

## 2019-07-04 MED ORDER — FLUCONAZOLE 200 MG PO TABS: 200.0000 mg | ORAL_TABLET | Freq: Once | ORAL | 0 refills | Status: AC

## 2019-07-04 NOTE — Discharge Instructions (Signed)
Recommend follow up w/ urology for further evaluation of recurrent UTI. Will call if antibiotic needs to be adjusted based on culture. Return for worsening pain, blood in urine, sever back/abdominal pain, fever.

## 2019-07-04 NOTE — ED Triage Notes (Signed)
Pt c/o urinary frequency, urgency, and pain since Monday

## 2019-07-04 NOTE — ED Provider Notes (Signed)
[facility]    CSN: [id number] Arrival date & time: 07/04/19  1509      History   Chief Complaint Chief Complaint  Patient presents with  . Urinary Tract Infection    HPI Allison Nelson is a 43 y.o. female presenting with urinary frequency, urgency, suprapubic discomfort/pressure since Monday.  Patient states this is now her fourth UTI this year: Has not yet been evaluated by urology, though willing to do so.  Has not tried thing for her current symptoms.  Patient reporting history of pyelonephritis: Chart review done by me at time of appointment showing ER visit on 03/30/2019: Urine dipstick abnormal, though culture showed normal vaginal flora-CT showing nonobstructing stones within the right renal collecting system and multiple areas of cortical thinning consistent with previous infection.  Past Medical History:  Diagnosis Date  . Anxiety   . Blood type, Rh negative   . BV (bacterial vaginosis)   . Depression   . GBS (group B streptococcus) infection   . Hx: UTI (urinary tract infection)   . IBS (irritable bowel syndrome)     Patient Active Problem List   Diagnosis Date Noted  . Adjustment disorder with disturbance of emotion 07/31/2017  . Obesity, unspecified 09/29/2013  . Post term pregnancy over 40 weeks 09/29/2013  . 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 Pruritus 09/29/2013  . Chronic headache 09/29/2013  . NSVD (normal spontaneous vaginal delivery) 09/29/2013  . IBS (irritable bowel syndrome) 03/06/2012  . Ovarian cyst 03/06/2012  . Blood type, Rh negative 03/06/2012    Past Surgical History:  Procedure Laterality Date  . BREAST LUMPECTOMY  10/2004  . BREAST SURGERY  11/2008   Breast Implants  . BUNIONECTOMY  09/2004   right bunion  . FOOT SURGERY  09/2005   remove pins from previous surgery  . GANGLION CYST EXCISION  05/17/2001   Right wrist  . KNEE SURGERY     Torn meniscus  . LUMBAR LAMINECTOMY  09/2002    OB History    Gravida  3    Para  3   Term  3   Preterm      AB      Living  3     SAB      TAB      Ectopic      Multiple      Live Births  1            Home Medications    Prior to Admission medications   Medication Sig Start Date End Date Taking? Authorizing Provider  ARIPiprazole (ABILIFY) 5 MG tablet Take 5 mg by mouth daily.   Yes [provider]  sertraline (ZOLOFT) 100 MG tablet Take 100 mg by mouth 2 (two) times daily.   Yes [provider]  amphetamine-dextroamphetamine (ADDERALL) 20 MG tablet Take 20 mg by mouth 3 (three) times daily. 07/22/17   [provider]  cephALEXin (KEFLEX) 500 MG capsule Take 1 capsule (500 mg total) by mouth 2 (two) times daily for 5 days. 07/04/19 07/09/19  Hall-Potvin, [place], PA-C  cetirizine (ZYRTEC) 10 MG tablet Take 10 mg by mouth daily.    [provider]  cyclobenzaprine (FLEXERIL) 10 MG tablet Take 10 mg by mouth 3 (three) times daily as needed for muscle spasms.  09/19/14   [provider]  drospirenone-ethinyl estradiol (NIKKI) 3-0.02 MG tablet Take 1 tablet by mouth daily. 07/11/17   [provider]  gabapentin (NEURONTIN) 300 MG capsule Take 300  mg by mouth daily. 07/26/17   [provider]  hydrOXYzine (ATARAX/VISTARIL) 25 MG tablet Take 25 mg by mouth every 6 (six) hours as needed for anxiety.     [provider]  hyoscyamine (LEVSIN, ANASPAZ) 0.125 MG tablet Take 0.125 mg by mouth every 6 (six) hours as needed for cramping.  07/28/17   [provider]  meloxicam (MOBIC) 15 MG tablet Take 15 mg by mouth daily. 07/12/17   [provider]  Multiple Vitamin (MULTI-VITAMINS) TABS Take 1 tablet by mouth daily.    [provider]  SUMAtriptan (IMITREX) 100 MG tablet Take 100 mg by mouth every 8 (eight) hours as needed for migraine or headache.  07/11/17   [provider]  SUMAtriptan 6 MG/0.5ML SOAJ Inject 0.5 mLs as directed daily as needed  (migraine).  07/11/17   [provider]  Vitamin D, Ergocalciferol, (DRISDOL) 50000 units CAPS capsule Take 50,000 Units by mouth every Thursday. 06/08/17   [provider]  zolpidem (AMBIEN) 10 MG tablet Take 10 mg by mouth at bedtime as needed for sleep.  05/20/17   [provider]    Family History Family History  Problem Relation Age of Onset  . Cancer Paternal Grandfather        LUNG  . Heart disease Paternal Grandmother   . Hyperlipidemia Father     Social History Social History   Tobacco Use  . Smoking status: Never Smoker  . Smokeless tobacco: Never Used  Substance Use Topics  . Alcohol use: Not Currently    Comment: Wine & beer on weekends  . Drug use: No     Allergies   Patient has no known allergies.   Review of Systems Review of Systems  Constitutional: Negative for fatigue and fever.  Respiratory: Negative for cough and shortness of breath.   Cardiovascular: Negative for chest pain and palpitations.  Gastrointestinal: Positive for abdominal pain. Negative for abdominal distention, blood in stool, constipation, diarrhea, nausea and vomiting.  Genitourinary: Positive for dysuria, frequency and urgency. Negative for flank pain, hematuria, pelvic pain, vaginal bleeding, vaginal discharge and vaginal pain.     Physical Exam Triage Vital Signs ED Triage Vitals  Enc Vitals Group     BP 07/04/19 1530 (!) 135/93     Pulse Rate 07/04/19 1530 81     Resp 07/04/19 1530 18     Temp 07/04/19 1530 98.2 F (36.8 C)     Temp Source 07/04/19 1530 Oral     SpO2 07/04/19 1530 98 %     Weight --      Height --      Head Circumference --      Peak Flow --      Pain Score 07/04/19 1531 0     Pain Loc --      Pain Edu? --      Excl. in GC? --    No data found.  Updated Vital Signs BP (!) 135/93 (BP Location: Left Arm)   Pulse 81   Temp 98.2 F (36.8 C) (Oral)   Resp 18   SpO2 98%   Visual Acuity Right Eye Distance:   Left Eye  Distance:   Bilateral Distance:    Right Eye Near:   Left Eye Near:    Bilateral Near:     Physical Exam Constitutional:      General: She is not in acute distress. HENT:     Head: Normocephalic and atraumatic.  Eyes:  General: No scleral icterus.    Pupils: Pupils are equal, round, and reactive to light.  Cardiovascular:     Rate and Rhythm: Normal rate.  Pulmonary:     Effort: Pulmonary effort is normal.  Abdominal:     General: Bowel sounds are normal.     Palpations: Abdomen is soft.     Tenderness: There is no right CVA tenderness, left CVA tenderness or guarding.     Comments: Mild discomfort with deep suprapubic palpation  Genitourinary:    Comments: Patient declined Skin:    Coloration: Skin is not jaundiced or pale.  Neurological:     Mental Status: She is alert and oriented to person, place, and time.      UC Treatments / Results  Labs (all labs ordered are listed, but only abnormal results are displayed) Labs Reviewed  URINE CULTURE - Abnormal; Notable for the following components:      Result Value   Culture 70,000 COLONIES/mL GRAM NEGATIVE RODS (*)    All other components within normal limits  POCT URINALYSIS DIP (MANUAL ENTRY) - Abnormal; Notable for the following components:   Clarity, UA cloudy (*)    Ketones, POC UA trace (5) (*)    Blood, UA small (*)    Protein Ur, POC =30 (*)    Leukocytes, UA Moderate (2+) (*)    All other components within normal limits    EKG   Radiology No results found.  Procedures Procedures (including critical care time)  Medications Ordered in UC Medications - No data to display  Initial Impression / Assessment and Plan / UC Course  I have reviewed the triage vital signs and the nursing notes.  Pertinent labs & imaging results that were available during my care of the patient were reviewed by me and considered in my medical decision making (see chart for details).     Patient afebrile, nontoxic.   POCT urine dipstick showing trace ketones, small blood, protein, moderate leukocytes.  Culture pending.  Will initiate Keflex today.  Patient endorsing history of yeast infections status post antibiotic use: Diflucan sent as well.  Provided urology contact information for referral from PCP.  Return precautions discussed, patient verbalized understanding and is agreeable to plan. Final Clinical Impressions(s) / UC Diagnoses   Final diagnoses:  Urinary frequency  Recurrent UTI (urinary tract infection)     Discharge Instructions     Recommend follow up w/ urology for further evaluation of recurrent UTI. Will call if antibiotic needs to be adjusted based on culture. Return for worsening pain, blood in urine, sever back/abdominal pain, fever.    ED Prescriptions    Medication Sig Dispense Auth. Provider   fluconazole (DIFLUCAN) 200 MG tablet Take 1 tablet (200 mg total) by mouth once for 1 dose. May repeat in 72 hours if needed 2 tablet Hall-Potvin, [place], PA-C   cephALEXin (KEFLEX) 500 MG capsule Take 1 capsule (500 mg total) by mouth 2 (two) times daily for 5 days. 10 capsule Hall-Potvin, [place], PA-C     PDMP not reviewed this encounter.   Odette FractionHall-Potvin, [place], [place]-C 07/06/19 1731

## 2019-07-07 LAB — URINE CULTURE: Culture: 70000 — AB

## 2019-07-09 ENCOUNTER — Telehealth (HOSPITAL_COMMUNITY): Payer: Self-pay | Admitting: Emergency Medicine

## 2019-07-09 MED ORDER — NITROFURANTOIN MONOHYD MACRO 100 MG PO CAPS: 100.0000 mg | ORAL_CAPSULE | Freq: Two times a day (BID) | ORAL | 0 refills | Status: DC

## 2019-07-09 NOTE — Telephone Encounter (Signed)
Urine culture was positive for e coli resistant to keflex given at urgent care visit. Prescription for macrobid BID per kelly sent to pharmacy of choice. Attempted to reach patient. No answer at this time. Voicemail left.

## 2019-07-11 ENCOUNTER — Telehealth: Payer: Self-pay | Admitting: Emergency Medicine

## 2019-07-11 NOTE — Telephone Encounter (Signed)
Attempted to reach patient to inform her of resistant culture, no answer, left voicemail to return call.

## 2019-07-12 ENCOUNTER — Telehealth (HOSPITAL_COMMUNITY): Payer: Self-pay | Admitting: Emergency Medicine

## 2019-07-12 NOTE — Telephone Encounter (Signed)
Attempted to reach patient x3. No answer at this time. Voicemail left. Letter sent    

## 2019-10-09 ENCOUNTER — Other Ambulatory Visit: Payer: Self-pay

## 2019-10-09 ENCOUNTER — Encounter: Payer: Self-pay | Admitting: Urology

## 2019-10-09 ENCOUNTER — Ambulatory Visit (INDEPENDENT_AMBULATORY_CARE_PROVIDER_SITE_OTHER): Payer: Managed Care, Other (non HMO) | Admitting: Urology

## 2019-10-09 VITALS — BP 127/82 | HR 102 | Ht 67.0 in | Wt 196.0 lb

## 2019-10-09 DIAGNOSIS — K5909 Other constipation: Secondary | ICD-10-CM

## 2019-10-09 DIAGNOSIS — R3129 Other microscopic hematuria: Secondary | ICD-10-CM

## 2019-10-09 DIAGNOSIS — N390 Urinary tract infection, site not specified: Secondary | ICD-10-CM

## 2019-10-09 DIAGNOSIS — R3915 Urgency of urination: Secondary | ICD-10-CM

## 2019-10-09 LAB — BLADDER SCAN AMB NON-IMAGING: Scan Result: 0

## 2019-10-09 MED ORDER — ESTRADIOL 0.1 MG/GM VA CREA: TOPICAL_CREAM | VAGINAL | 12 refills | Status: DC

## 2019-10-09 MED ORDER — PREMARIN 0.625 MG/GM VA CREA: 1.0000 | TOPICAL_CREAM | Freq: Every day | VAGINAL | 12 refills | Status: DC

## 2019-10-09 NOTE — Addendum Note (Signed)
Addended by: Milas Kocher A on: 10/09/2019 04:04 PM   Modules accepted: Orders

## 2019-10-09 NOTE — Progress Notes (Signed)
10/09/2019 11:41 AM   Allison Nelson 14-Jan-1976 [id number]  Referring provider: Rosalio Macadamia, MD No address on file  Chief Complaint  Patient presents with  . Recurrent UTI    HPI: 44-year female referred for further evaluation of recurrent urinary tract infections.  She had 2 documented infections over the past 12 months.  She also reports calling in for antibiotics on the weekend approximately twice over the past 12 months without giving a UA/urine sample as her symptoms occurred on the weekend.  She reports that she is a lifelong issue with recurrent urinary tract infections and pyelonephritis.  She is had several kidney infections associated with fevers throughout her life.  She was seen and evaluated on urgent care on 03/30/2019 with fevers chills.  She was tested for COVID-19 which was negative but her urine at the time was also frankly positive.  A culture was not sent.  He was started on Cipro.  She did have presented to the emergency room later that same day again with a suspicious appearing urine.  This culture was negative but she is already taking antibiotics.  She had a CT scan performed at this time, stone study which indicated right renal atrophy with cortical thinning likely related to multiple episodes of pyelonephritis in the past.  She multiple punctate small right renal stones, nonobstructing.  Left kidney is normal.  She was also treated in the emergency room with traditional UTI type symptoms including for frequency urgency pelvic pain dysuria for an ESBL E. coli urinary tract infection.  She continues to take OCPs.  She was tested to see if she is perimenopausal a few years back which she was told she is not.  Her periods are very light and irregular.  She has had occasional hot flashes but not consistently.  She does struggle with vaginal dryness.  She does have a history of chronic constipation which is lifelong.  She also notes that since her vaginal  delivery 6 years ago, she has had issues with seepage of stool.  Often, her stool is rapid ball in consistency.  Her anal leakage is often after she has a bowel movement.  She started taking magnesium again.  This is been helpful in regulating her stools.  She occasionally does a bowel cleanout with mag citrate.  She is not on any stool softeners for maintenance.  In terms of her bladder, she does have some urinary urgency and frequency.  She reports not drinking enough water.  She does drink 1 cup of coffee in the morning.  No issues with incontinence.  She is bothered by her symptoms but not enough to take medication.   PMH: Past Medical History:  Diagnosis Date  . Anxiety   . Blood type, Rh negative   . BV (bacterial vaginosis)   . Depression   . GBS (group B streptococcus) infection   . Hx: UTI (urinary tract infection)   . IBS (irritable bowel syndrome)     Surgical History: Past Surgical History:  Procedure Laterality Date  . BREAST LUMPECTOMY  10/2004  . BREAST SURGERY  11/2008   Breast Implants  . BUNIONECTOMY  09/2004   right bunion  . FOOT SURGERY  09/2005   remove pins from previous surgery  . GANGLION CYST EXCISION  05/17/2001   Right wrist  . KNEE SURGERY     Torn meniscus  . LUMBAR LAMINECTOMY  09/2002    Home Medications:  Allergies as of 10/09/2019   No Known  Allergies     Medication List       Accurate as of October 09, 2019 11:41 AM. If you have any questions, ask your nurse or doctor.        STOP taking these medications   nitrofurantoin (macrocrystal-monohydrate) 100 MG capsule Commonly known as: MACROBID Stopped by: Vanna Scotland, MD     TAKE these medications   amphetamine-dextroamphetamine 20 MG tablet Commonly known as: ADDERALL Take 20 mg by mouth 3 (three) times daily.   ARIPiprazole 5 MG tablet Commonly known as: ABILIFY Take 5 mg by mouth daily.   cetirizine 10 MG tablet Commonly known as: ZYRTEC Take 10 mg by mouth daily.     cyclobenzaprine 10 MG tablet Commonly known as: FLEXERIL Take 10 mg by mouth 3 (three) times daily as needed for muscle spasms.   gabapentin 300 MG capsule Commonly known as: NEURONTIN Take 300 mg by mouth daily.   hydrOXYzine 25 MG tablet Commonly known as: ATARAX/VISTARIL Take 25 mg by mouth every 6 (six) hours as needed for anxiety.   hyoscyamine 0.125 MG tablet Commonly known as: LEVSIN Take 0.125 mg by mouth every 6 (six) hours as needed for cramping.   meloxicam 15 MG tablet Commonly known as: MOBIC Take 15 mg by mouth daily.   Multi-Vitamins Tabs Take 1 tablet by mouth daily.   Nikki 3-0.02 MG tablet Generic drug: drospirenone-ethinyl estradiol Take 1 tablet by mouth daily.   Premarin vaginal cream Generic drug: conjugated estrogens Place 1 Applicatorful vaginally daily. Use pea sized amount M-W-Fr before bedtime Started by: Vanna Scotland, MD   sertraline 100 MG tablet Commonly known as: ZOLOFT Take 100 mg by mouth 2 (two) times daily.   SUMAtriptan 6 MG/0.5ML Soaj Inject 0.5 mLs as directed daily as needed (migraine).   SUMAtriptan 100 MG tablet Commonly known as: IMITREX Take 100 mg by mouth every 8 (eight) hours as needed for migraine or headache.   Vitamin D (Ergocalciferol) 1.25 MG (50000 UNIT) Caps capsule Commonly known as: DRISDOL Take 50,000 Units by mouth every Thursday.   zolpidem 10 MG tablet Commonly known as: AMBIEN Take 10 mg by mouth at bedtime as needed for sleep.       Allergies: No Known Allergies  Family History: Family History  Problem Relation Age of Onset  . Cancer Paternal Grandfather        LUNG  . Heart disease Paternal Grandmother   . Hyperlipidemia Father     Social History:  reports that she has never smoked. She has never used smokeless tobacco. She reports previous alcohol use. She reports that she does not use drugs.  ROS: UROLOGY Frequent Urination?: Yes Hard to postpone urination?: No Burning/pain with  urination?: No Get up at night to urinate?: Yes Leakage of urine?: No Urine stream starts and stops?: No Trouble starting stream?: No Do you have to strain to urinate?: No Blood in urine?: No Urinary tract infection?: No Sexually transmitted disease?: No Injury to kidneys or bladder?: No Painful intercourse?: No Weak stream?: No Currently pregnant?: No Vaginal bleeding?: No Last menstrual period?: N  Gastrointestinal Nausea?: No Vomiting?: No Indigestion/heartburn?: No Diarrhea?: No Constipation?: No  Constitutional Fever: No Night sweats?: No Weight loss?: No Fatigue?: No  Skin Skin rash/lesions?: No Itching?: No  Eyes Blurred vision?: No Double vision?: No  Ears/Nose/Throat Sore throat?: No Sinus problems?: No  Hematologic/Lymphatic Swollen glands?: No Easy bruising?: No  Cardiovascular Leg swelling?: No Chest pain?: No  Respiratory Cough?: No Shortness of breath?:  No  Endocrine Excessive thirst?: No  Musculoskeletal Back pain?: No Joint pain?: No  Neurological Headaches?: No Dizziness?: No  Psychologic Depression?: No Anxiety?: No  Physical Exam: BP 127/82   Pulse (!) 102   Ht 5\' 7"  (1.702 m)   Wt 196 lb (88.9 kg)   BMI 30.70 kg/m   Constitutional:  Alert and oriented, No acute distress. HEENT: [place] AT, moist mucus membranes.  Trachea midline, no masses. Cardiovascular: No clubbing, cyanosis, or edema. Respiratory: Normal respiratory effort, no increased work of breathing. Skin: No rashes, bruises or suspicious lesions. Neurologic: Grossly intact, no focal deficits, moving all 4 extremities. Psychiatric: Normal mood and affect.  Laboratory Data: Lab Results  Component Value Date   WBC 10.6 (H) 03/30/2019   HGB 11.6 (L) 03/30/2019   HCT 35.6 (L) 03/30/2019   MCV 89.4 03/30/2019   PLT 225 03/30/2019    Lab Results  Component Value Date   CREATININE 0.71 03/30/2019    Urinalysis UA today with 3-10 red blood cells per  high-power field.  Otherwise unremarkable urine.  She denies menstruating today.  Pertinent Imaging: Results for orders placed during the hospital encounter of 03/30/19  CT Renal Stone Study   Narrative CLINICAL DATA:  Right-sided cramping.  EXAM: CT ABDOMEN AND PELVIS WITHOUT CONTRAST  TECHNIQUE: Multidetector CT imaging of the abdomen and pelvis was performed following the standard protocol without IV contrast.  COMPARISON:  None.  FINDINGS: Lower chest: The lung bases are clear. The heart size is normal.  Hepatobiliary: The liver is normal. Normal gallbladder.There is no biliary ductal dilation.  Pancreas: Normal contours without ductal dilatation. No peripancreatic fluid collection.  Spleen: No splenic laceration or hematoma.  Adrenals/Urinary Tract:  --Adrenal glands: No adrenal hemorrhage.  --Right kidney/ureter: The right kidney is atrophic with significant areas of cortical thinning. There are multiple punctate nonobstructing stones in the right renal collecting system without evidence of hydronephrosis.  --Left kidney/ureter: No hydronephrosis or perinephric hematoma.  --Urinary bladder: Unremarkable.  Stomach/Bowel:  --Stomach/Duodenum: No hiatal hernia or other gastric abnormality. Normal duodenal course and caliber.  --Small bowel: No dilatation or inflammation.  --Colon: No focal abnormality.  --Appendix: Normal.  Vascular/Lymphatic: Normal course and caliber of the major abdominal vessels.  --No retroperitoneal lymphadenopathy.  --No mesenteric lymphadenopathy.  --No pelvic or inguinal lymphadenopathy.  Reproductive: Unremarkable  Other: No ascites or free air. The abdominal wall is normal.  Musculoskeletal. Multilevel degenerative disc disease and facet arthrosis. No bony spinal canal stenosis.  IMPRESSION: 1. No acute abnormality detected. 2. Atrophic right kidney with multiple areas of cortical thinning likely related to prior  infectious insults. There are multiple nonobstructing stones in the right renal collecting system without evidence for hydronephrosis. The left kidney is unremarkable. 3. Normal appendix in the right lower quadrant.   Electronically Signed   By: Constance Holster M.D.   On: 03/30/2019 21:08    CT scan was personally reviewed.  Agree with above radiologic interpretation.  Assessment & Plan:    1. Recurrent UTI Lifelong history of recurrent urinary tract infections, most recently with a ESBL E. coli  We discussed indications for treating infections which include symptom such as fevers or lower urinary tract symptoms.  I would avoid treating incidental positive urines or checking urinalysis for foul-smelling urine.  Strongly recommend UA/urine culture with culture appropriate antibiotics especially given her evolving resistance pattern.  In addition to the above, we discussed hygiene recommendations.  Have also recommend starting a daily probiotic as well as  cranberry tablets twice daily and discussed the rationale for this.  Lastly, we discussed that given that she is likely transitioning into the perimenopausal state, she may benefit from a little bit of topical estrogen cream per urethral meatus.  We discussed applying this, pea-sized amount per urethral meatus 3 times a week.  Risk benefits were discussed.  She was given samples and a prescription for this today as well.  Given her presence of microscopic hematuria today, will evaluate any underlying bladder/anatomic issues with pelvic exam at the time of cystoscopy as outlined below.  She is agreeable this plan.  Lastly, CT scan does indicate personal history of recurrent pyelonephritis.  Punctate stones in right kidney not likely underlying issue would not recommend any intervention for these. - Urinalysis, Complete - Bladder Scan (Post Void Residual) in office  2. Chronic constipation The discussion today on importance of bowel  management as relates to recurrent urinary tract infections and bladder dysfunction.  Recommended a bowel cleanout followed by stool softener once or twice daily as needed.  Of asked her to follow-up with gastroenterology as well.  She is agreeable this plan.  3. Urinary urgency Baseline urinary urgency frequency which is bothersome to her.  Primarily, we discussed behavioral modification.  We discussed the option of pharmacotherapy however given her history of chronic constipation, concerned that anticholinergic may exacerbate her above symptoms.  Alternative including Myrbetriq was also discussed.  She like to hold off for now.  We will discuss in the future if her symptoms worsen.  4. Microscopic hematuria Incidental microscopic hematuria today  We will evaluate further with cystoscopy.  Given that she follows a low risk category, will defer any further follow-up imaging in light of above CT scan.  She is agreeable this plan.   Return for cysto/ pelvic.  Vanna Scotland, MD  [facility] [address] [place], [place] [address] [phone]

## 2019-10-10 LAB — MICROSCOPIC EXAMINATION

## 2019-10-10 LAB — URINALYSIS, COMPLETE
Bilirubin, UA: NEGATIVE
Glucose, UA: NEGATIVE
Ketones, UA: NEGATIVE
Leukocytes,UA: NEGATIVE
Nitrite, UA: NEGATIVE
Protein,UA: NEGATIVE
Specific Gravity, UA: 1.02 (ref 1.005–1.030)
Urobilinogen, Ur: 0.2 mg/dL (ref 0.2–1.0)
pH, UA: 6.5 (ref 5.0–7.5)

## 2019-10-30 ENCOUNTER — Other Ambulatory Visit: Payer: Self-pay | Admitting: Urology

## 2019-11-06 ENCOUNTER — Encounter: Payer: Self-pay | Admitting: Urology

## 2019-11-06 ENCOUNTER — Ambulatory Visit (INDEPENDENT_AMBULATORY_CARE_PROVIDER_SITE_OTHER): Payer: Managed Care, Other (non HMO) | Admitting: Urology

## 2019-11-06 ENCOUNTER — Other Ambulatory Visit: Payer: Self-pay

## 2019-11-06 VITALS — BP 148/83 | HR 73 | Ht 67.0 in | Wt 196.0 lb

## 2019-11-06 DIAGNOSIS — R3129 Other microscopic hematuria: Secondary | ICD-10-CM | POA: Diagnosis not present

## 2019-11-06 DIAGNOSIS — N390 Urinary tract infection, site not specified: Secondary | ICD-10-CM

## 2019-11-06 LAB — URINALYSIS, COMPLETE
Bilirubin, UA: NEGATIVE
Glucose, UA: NEGATIVE
Ketones, UA: NEGATIVE
Leukocytes,UA: NEGATIVE
Nitrite, UA: NEGATIVE
Protein,UA: NEGATIVE
Specific Gravity, UA: 1.015 (ref 1.005–1.030)
Urobilinogen, Ur: 0.2 mg/dL (ref 0.2–1.0)
pH, UA: 7.5 (ref 5.0–7.5)

## 2019-11-06 LAB — MICROSCOPIC EXAMINATION: Bacteria, UA: NONE SEEN

## 2019-11-06 NOTE — Progress Notes (Signed)
   11/06/19  CC:  Chief Complaint  Patient presents with  . Cysto    HPI: 44 yo F with recurrent UTI/ microscopic hematuria who presents today for cysto.    Please see previous note for details.    NED. A&Ox3.   No respiratory distress   Abd soft, NT, ND Normal external genitalia with patent urethral meatus  Cystoscopy Procedure Note  Patient identification was confirmed, informed consent was obtained, and patient was prepped using Betadine solution.  Lidocaine jelly was administered per urethral meatus.    Procedure: - Flexible cystoscope introduced, without any difficulty.   - Thorough search of the bladder revealed:    normal urethral meatus    normal urothelium    no stones    no ulcers     no tumors    no urethral polyps    no trabeculation  - Ureteral orifices were normal in position and appearance.  Post-Procedure: - Patient tolerated the procedure well  Assessment/ Plan:  1. Microscopic hematuria No evidence of disease today on cystoscopy  - Urinalysis, Complete  2. Recurrent UTI Continue topical estrogen cream  Advised to follow-up if she has any signs or symptoms of urinary tract infection for treatment and possible additional intervention as deemed necessary   Vanna Scotland, MD

## 2019-12-18 ENCOUNTER — Ambulatory Visit: Payer: Managed Care, Other (non HMO) | Admitting: Podiatry

## 2019-12-27 ENCOUNTER — Ambulatory Visit: Payer: Managed Care, Other (non HMO) | Admitting: Podiatry

## 2020-01-10 ENCOUNTER — Encounter: Payer: Self-pay | Admitting: Podiatry

## 2020-01-10 ENCOUNTER — Other Ambulatory Visit: Payer: Self-pay

## 2020-01-10 ENCOUNTER — Ambulatory Visit: Payer: Managed Care, Other (non HMO) | Admitting: Podiatry

## 2020-01-10 ENCOUNTER — Ambulatory Visit (INDEPENDENT_AMBULATORY_CARE_PROVIDER_SITE_OTHER): Payer: Managed Care, Other (non HMO)

## 2020-01-10 ENCOUNTER — Ambulatory Visit (INDEPENDENT_AMBULATORY_CARE_PROVIDER_SITE_OTHER): Payer: Managed Care, Other (non HMO) | Admitting: Podiatry

## 2020-01-10 VITALS — BP 118/72 | HR 82 | Resp 16

## 2020-01-10 DIAGNOSIS — M722 Plantar fascial fibromatosis: Secondary | ICD-10-CM

## 2020-01-10 DIAGNOSIS — J301 Allergic rhinitis due to pollen: Secondary | ICD-10-CM | POA: Insufficient documentation

## 2020-01-10 DIAGNOSIS — M779 Enthesopathy, unspecified: Secondary | ICD-10-CM | POA: Diagnosis not present

## 2020-01-10 DIAGNOSIS — M4310 Spondylolisthesis, site unspecified: Secondary | ICD-10-CM | POA: Insufficient documentation

## 2020-01-10 DIAGNOSIS — M25859 Other specified joint disorders, unspecified hip: Secondary | ICD-10-CM | POA: Insufficient documentation

## 2020-01-10 DIAGNOSIS — F330 Major depressive disorder, recurrent, mild: Secondary | ICD-10-CM | POA: Insufficient documentation

## 2020-01-10 DIAGNOSIS — M222X9 Patellofemoral disorders, unspecified knee: Secondary | ICD-10-CM | POA: Insufficient documentation

## 2020-01-10 NOTE — Patient Instructions (Signed)

## 2020-01-12 NOTE — Progress Notes (Signed)
Subjective:   Patient ID: Allison Nelson, female   DOB: 44 y.o.   MRN: [id number]   HPI Patient presents stating she has had a long-term history of pain in her heels bilateral and she does work 12-hour shifts.  She states also her forefoot right over left has been bothering her some she has been taking meloxicam but that is not helping the pain in her heels and states it is worse when she gets up in the morning after periods of sitting.  Patient does not smoke likes to be active   Review of Systems  All other systems reviewed and are negative.       Objective:  Physical Exam Vitals and nursing note reviewed.  Constitutional:      Appearance: She is well-developed.  Pulmonary:     Effort: Pulmonary effort is normal.  Musculoskeletal:        General: Normal range of motion.  Skin:    General: Skin is warm.  Neurological:     Mental Status: She is alert.     Neurovascular status found to be intact muscle strength was found to be adequate and range of motion within normal limits.  Patient is noted to have inflammation of the significant nature plantar heel region bilateral with fluid buildup around the medial band with moderate depression of the arch and is also noted to have mild to moderate forefoot inflammation and pain localized with no specific area of acute discomfort.  Patient is found to have good digital perfusion well oriented x3     Assessment:  Acute plantar fasciitis bilateral with probability of compensation with forefoot inflammation     Plan:  H&P reviewed condition and at this point I went ahead and I want a focus on the heels.  I explained plantar fasciitis and educated her on this and x-rays and today I did sterile prep and injected the plantar fascial bilateral at its insertion 3 mg Kenalog 5 mg Xylocaine applied plantar fascial braces to lift the arches instructed on physical therapy shoe gear modifications placed on oral anti-inflammatories and will be seen  back in the next several weeks for reevaluation  X-rays indicate small spur no indication to stress fracture or advanced arthritis

## 2020-01-27 IMAGING — CT CT RENAL STONE PROTOCOL
2 of 4 series · 16 of 46 positions shown, 18 images · non-contrast
Comparison: None.

CLINICAL DATA: Right-sided cramping.

EXAM:
CT ABDOMEN AND PELVIS WITHOUT CONTRAST
TECHNIQUE: Multidetector CT imaging of the abdomen and pelvis was performed
following the standard protocol without IV contrast.

[Series 3: stone study 5.0 i30f 2 · axial · 0.91mm/px · z∈[+575,+1035]mm · 13 of 101 slices shown, 15 images]
[im 5/101  soft-tissue]
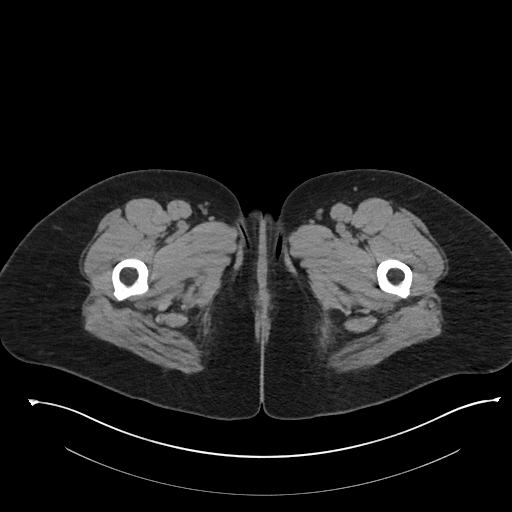
[im 5/101  bone]
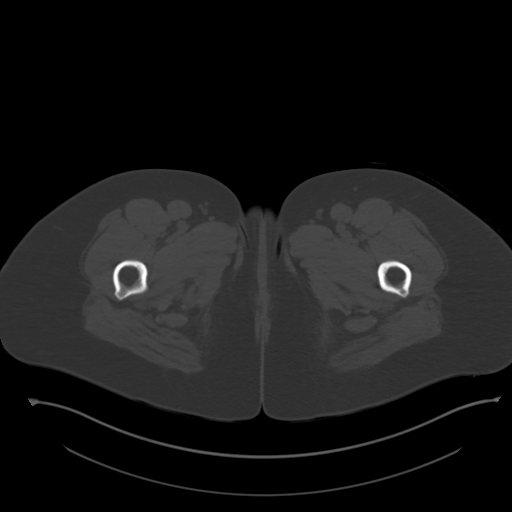
[im 13/101  soft-tissue]
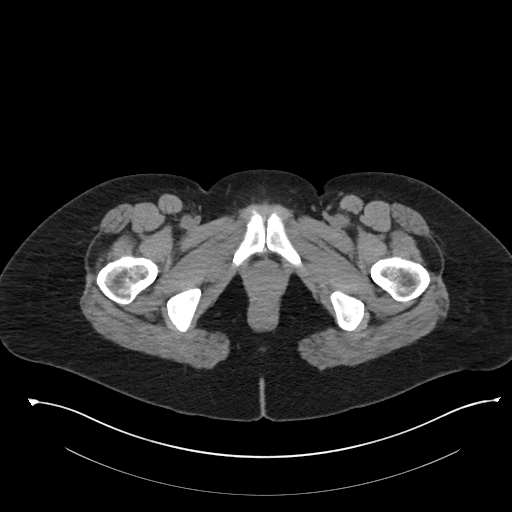
[im 21/101  soft-tissue]
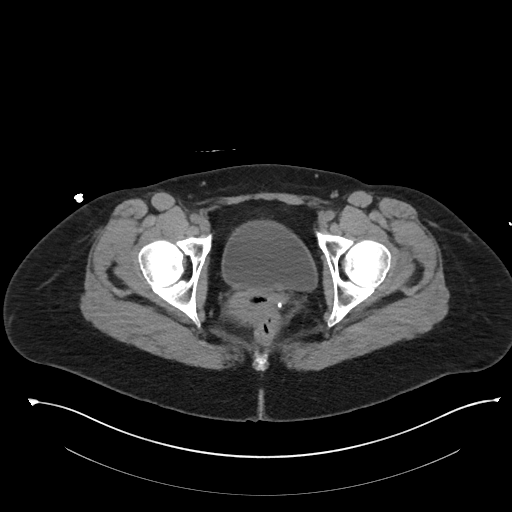
[im 29/101  soft-tissue]
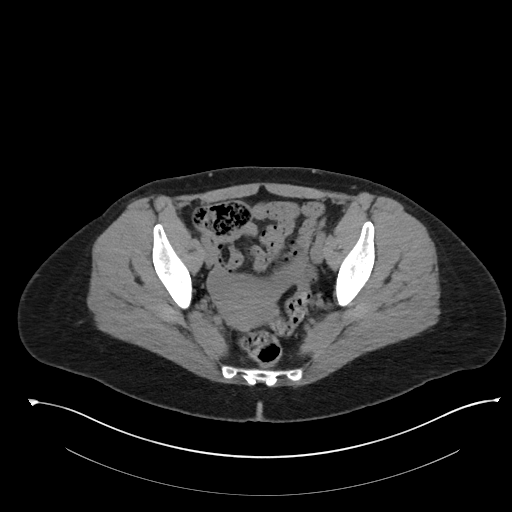
[im 37/101  soft-tissue]
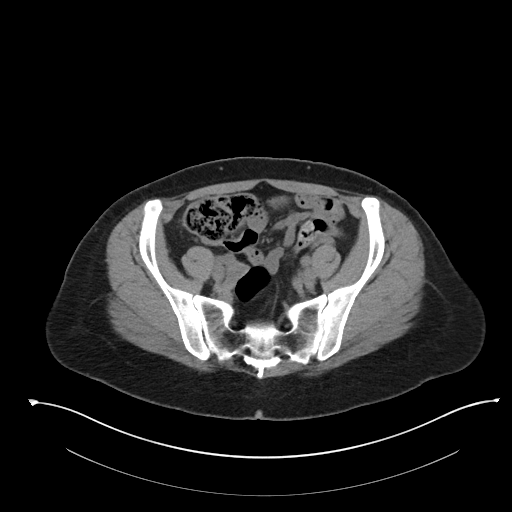
[im 45/101  soft-tissue]
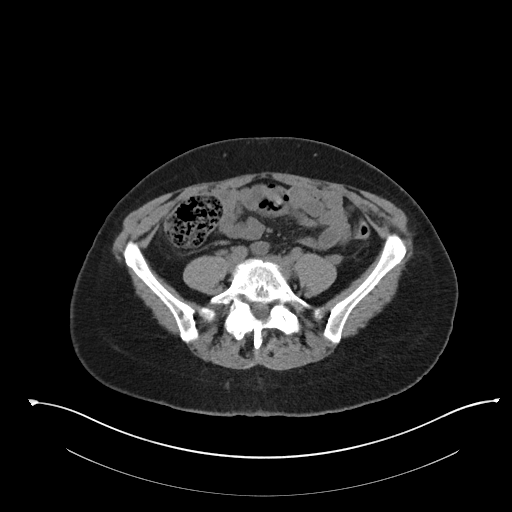
[im 53/101  soft-tissue]
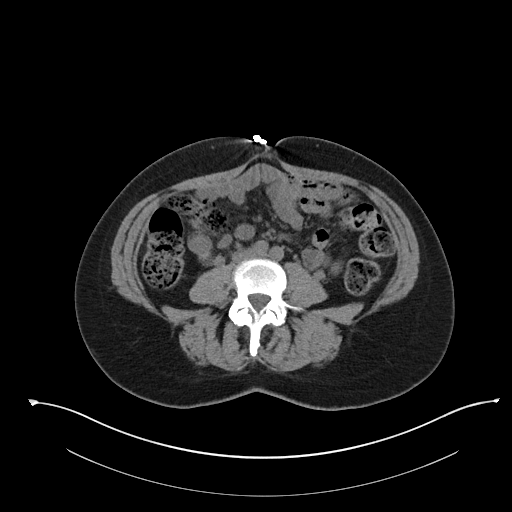
[im 57/101  soft-tissue]
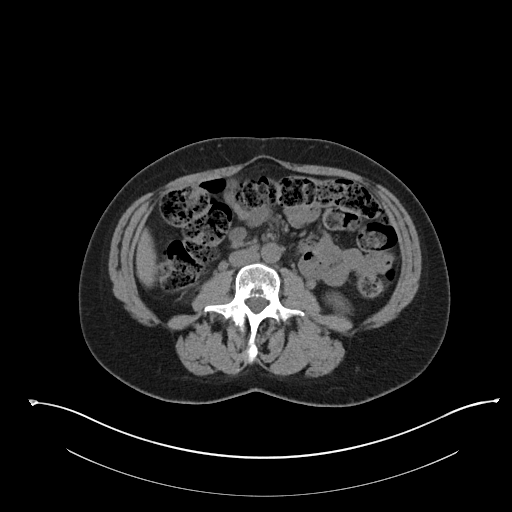
[im 65/101  soft-tissue]
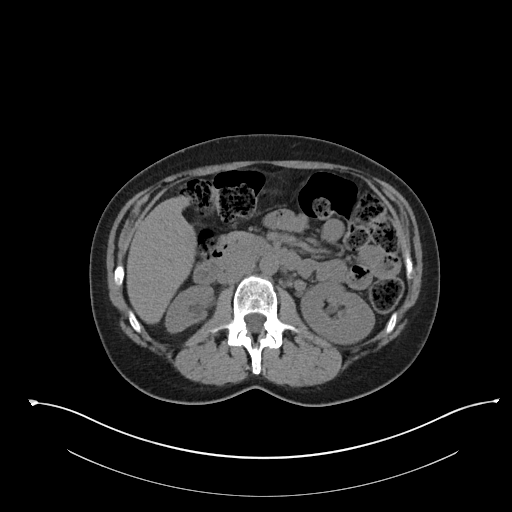
[im 65/101  bone]
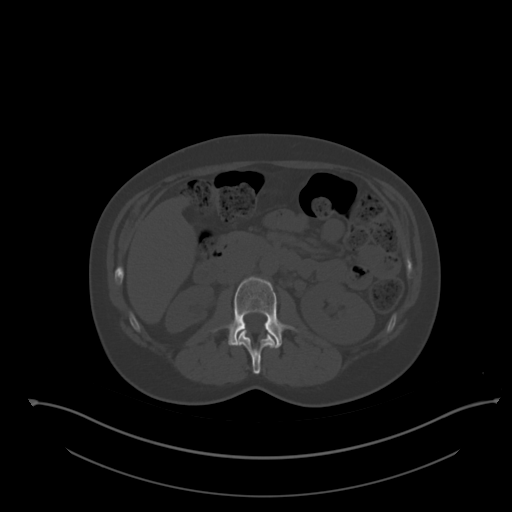
[im 73/101  soft-tissue]
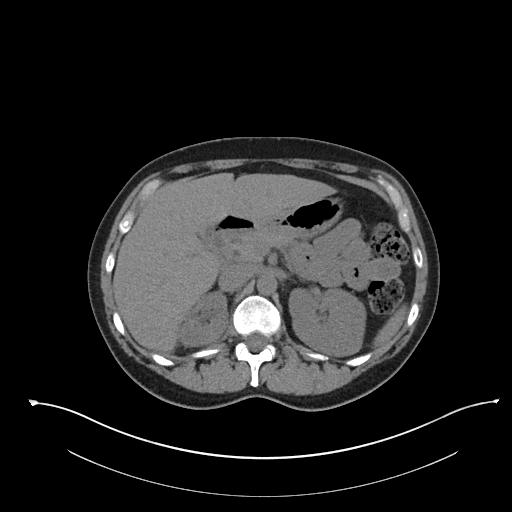
[im 81/101  soft-tissue]
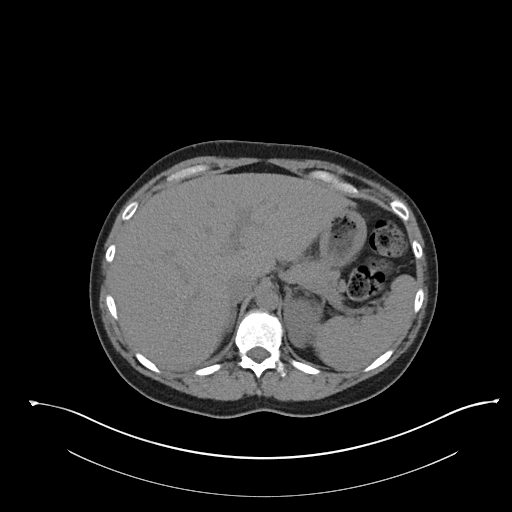
[im 89/101  soft-tissue]
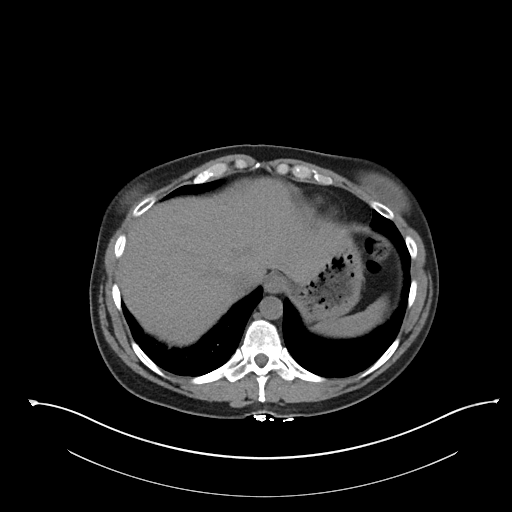
[im 97/101  soft-tissue]
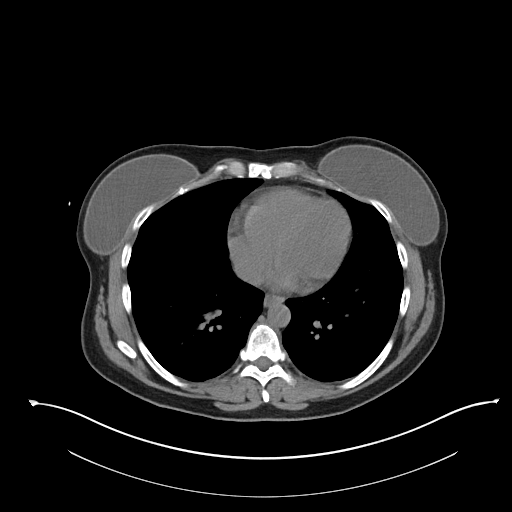

[Series 6: coronal soft tissue · coronal · 0.82mm/px · 3 of 101 slices shown]
[im 34/101  soft-tissue]
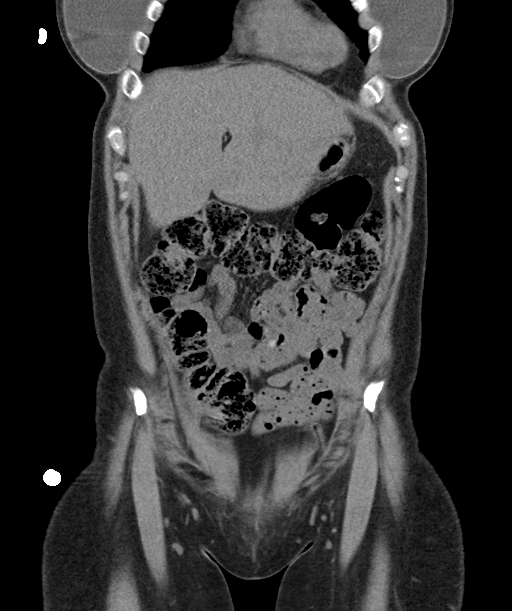
[im 45/101  soft-tissue]
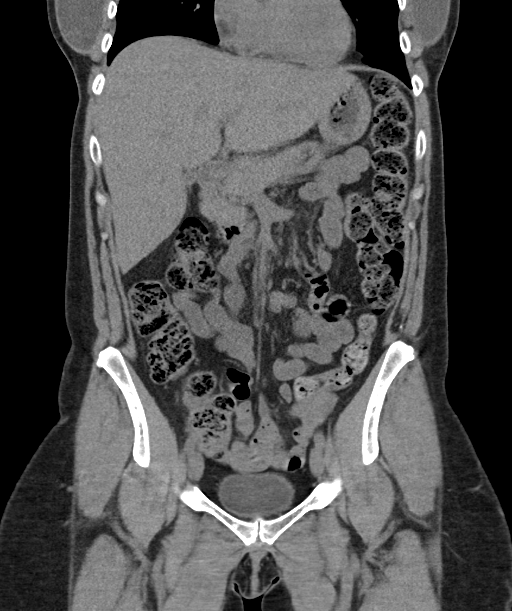
[im 56/101  soft-tissue]
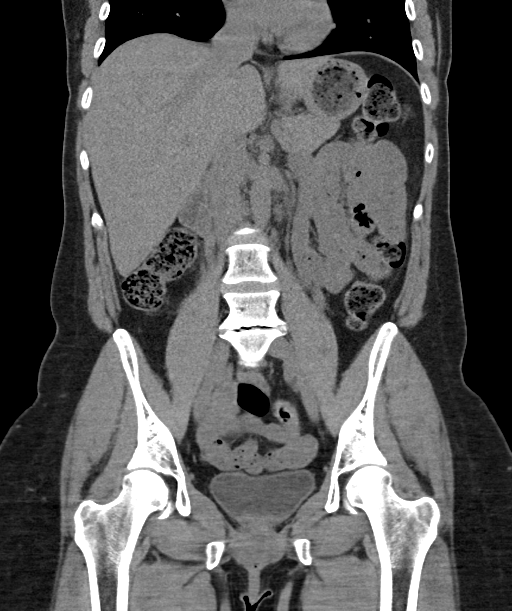

[16 of 46 positions shown; findings below may reference images not displayed]

FINDINGS: Lower chest: The lung bases are clear. The heart size is normal.

Hepatobiliary: The liver is normal. Normal gallbladder.There is no
biliary ductal dilation.

Pancreas: Normal contours without ductal dilatation. No
peripancreatic fluid collection.

Spleen: No splenic laceration or hematoma.

Adrenals/Urinary Tract:

--Adrenal glands: No adrenal hemorrhage.

--Right kidney/ureter: The right kidney is atrophic with significant
areas of cortical thinning. There are multiple punctate
nonobstructing stones in the right renal collecting system without
evidence of hydronephrosis.

--Left kidney/ureter: No hydronephrosis or perinephric hematoma.

--Urinary bladder: Unremarkable.

Stomach/Bowel:

--Stomach/Duodenum: No hiatal hernia or other gastric abnormality.
Normal duodenal course and caliber.

--Small bowel: No dilatation or inflammation.

--Colon: No focal abnormality.

--Appendix: Normal.

Vascular/Lymphatic: Normal course and caliber of the major abdominal
vessels.

--No retroperitoneal lymphadenopathy.

--No mesenteric lymphadenopathy.

--No pelvic or inguinal lymphadenopathy.

Reproductive: Unremarkable

Other: No ascites or free air. The abdominal wall is normal.

Musculoskeletal. Multilevel degenerative disc disease and facet
arthrosis. No bony spinal canal stenosis.
IMPRESSION: 1. No acute abnormality detected.
2. Atrophic right kidney with multiple areas of cortical thinning
likely related to prior infectious insults. There are multiple
nonobstructing stones in the right renal collecting system without
evidence for hydronephrosis. The left kidney is unremarkable.
3. Normal appendix in the right lower quadrant.

## 2020-01-29 ENCOUNTER — Other Ambulatory Visit: Payer: Self-pay

## 2020-01-29 ENCOUNTER — Encounter: Payer: Self-pay | Admitting: Podiatry

## 2020-01-29 ENCOUNTER — Ambulatory Visit (INDEPENDENT_AMBULATORY_CARE_PROVIDER_SITE_OTHER): Payer: Managed Care, Other (non HMO) | Admitting: Podiatry

## 2020-01-29 VITALS — Temp 97.4°F

## 2020-01-29 DIAGNOSIS — M779 Enthesopathy, unspecified: Secondary | ICD-10-CM | POA: Diagnosis not present

## 2020-01-29 DIAGNOSIS — M722 Plantar fascial fibromatosis: Secondary | ICD-10-CM

## 2020-01-29 NOTE — Progress Notes (Signed)
Subjective:   Patient ID: Allison Nelson, female   DOB: 44 y.o.   MRN: [id number]   HPI Patient states she feels better in her heels but she is having pain in her right forefoot.  States that the heels seem to have improved   ROS      Objective:  Physical Exam  Neurovascular status intact with patient's right and left heels doing well with minimal discomfort with quite a bit of inflammation pain of the third metatarsal phalangeal joint right     Assessment:  Improved from fasciitis bilateral with inflammatory capsulitis right     Plan:  H&P reviewed both conditions and for heels continue stretching exercises proper shoe gear and braces.  For the forefoot I did sterile prep injected 60 mg like Marcaine mixture and then aspirated the third MPJ getting out a small amount of clear fluid and injected quarter cc of dexamethasone with thick padding and will review results in the next 3 to 4 weeks

## 2020-02-17 ENCOUNTER — Encounter: Payer: Self-pay | Admitting: Podiatry

## 2020-02-17 ENCOUNTER — Ambulatory Visit (INDEPENDENT_AMBULATORY_CARE_PROVIDER_SITE_OTHER): Payer: Managed Care, Other (non HMO) | Admitting: Podiatry

## 2020-02-17 ENCOUNTER — Other Ambulatory Visit: Payer: Self-pay

## 2020-02-17 VITALS — HR 98

## 2020-02-17 DIAGNOSIS — M779 Enthesopathy, unspecified: Secondary | ICD-10-CM

## 2020-02-17 DIAGNOSIS — M722 Plantar fascial fibromatosis: Secondary | ICD-10-CM

## 2020-02-17 NOTE — Progress Notes (Signed)
Subjective:   Patient ID: Allison Nelson, female   DOB: 44 y.o.   MRN: [id number]   HPI Patient states that the pain has reoccurred more on the right over the left heel and states it is bad when she gets up in the morning after periods of sitting   ROS      Objective:  Physical Exam  Neurovascular status intact with exquisite discomfort plantar aspect heel right over left which reoccurred over the last 2 to 4 weeks     Assessment:  Continued inflammation fasciitis-like condition right over left     Plan:  H&P reviewed condition sterile prep done injected the fascia right 3 mg Kenalog 5 mg Xylocaine and advised on anti-inflammatories physical therapy to be continued.  Patient will be seen back to recheck and is encouraged to call and at this point had night splint dispensed with all instructions on usage

## 2020-03-30 ENCOUNTER — Ambulatory Visit: Payer: Managed Care, Other (non HMO) | Admitting: Podiatry

## 2022-04-25 ENCOUNTER — Other Ambulatory Visit: Payer: Self-pay | Admitting: Rheumatology

## 2022-04-25 DIAGNOSIS — M533 Sacrococcygeal disorders, not elsewhere classified: Secondary | ICD-10-CM

## 2022-05-13 ENCOUNTER — Ambulatory Visit
Admission: RE | Admit: 2022-05-13 | Discharge: 2022-05-13 | Disposition: A | Discharge status: Home / Self Care | Payer: Managed Care, Other (non HMO) | Source: Ambulatory Visit | Attending: Rheumatology

## 2022-05-13 DIAGNOSIS — M533 Sacrococcygeal disorders, not elsewhere classified: Secondary | ICD-10-CM

## 2022-05-13 MED ORDER — GADOBENATE DIMEGLUMINE 529 MG/ML IV SOLN
16.0000 mL | Freq: Once | INTRAVENOUS | Status: AC | PRN
  Administered 2022-05-13: 16 mL via INTRAVENOUS

## 2023-12-29 ENCOUNTER — Emergency Department (HOSPITAL_COMMUNITY)

## 2023-12-29 ENCOUNTER — Encounter (HOSPITAL_COMMUNITY): Payer: Self-pay

## 2023-12-29 ENCOUNTER — Other Ambulatory Visit: Payer: Self-pay

## 2023-12-29 ENCOUNTER — Observation Stay (HOSPITAL_COMMUNITY)
Admission: EM | Admit: 2023-12-29 | Discharge: 2023-12-31 | Disposition: A | Discharge status: Home / Self Care | Attending: Internal Medicine | Admitting: Internal Medicine

## 2023-12-29 DIAGNOSIS — F419 Anxiety disorder, unspecified: Secondary | ICD-10-CM | POA: Insufficient documentation

## 2023-12-29 DIAGNOSIS — Z79899 Other long term (current) drug therapy: Secondary | ICD-10-CM | POA: Diagnosis not present

## 2023-12-29 DIAGNOSIS — R079 Chest pain, unspecified: Secondary | ICD-10-CM | POA: Diagnosis present

## 2023-12-29 DIAGNOSIS — R519 Headache, unspecified: Secondary | ICD-10-CM | POA: Diagnosis not present

## 2023-12-29 DIAGNOSIS — F330 Major depressive disorder, recurrent, mild: Secondary | ICD-10-CM | POA: Insufficient documentation

## 2023-12-29 DIAGNOSIS — I10 Essential (primary) hypertension: Secondary | ICD-10-CM | POA: Insufficient documentation

## 2023-12-29 DIAGNOSIS — E871 Hypo-osmolality and hyponatremia: Secondary | ICD-10-CM | POA: Diagnosis not present

## 2023-12-29 DIAGNOSIS — N10 Acute tubulo-interstitial nephritis: Secondary | ICD-10-CM | POA: Diagnosis not present

## 2023-12-29 DIAGNOSIS — E876 Hypokalemia: Secondary | ICD-10-CM | POA: Insufficient documentation

## 2023-12-29 DIAGNOSIS — K5900 Constipation, unspecified: Secondary | ICD-10-CM | POA: Insufficient documentation

## 2023-12-29 DIAGNOSIS — G8929 Other chronic pain: Secondary | ICD-10-CM | POA: Diagnosis present

## 2023-12-29 DIAGNOSIS — N12 Tubulo-interstitial nephritis, not specified as acute or chronic: Principal | ICD-10-CM | POA: Diagnosis present

## 2023-12-29 LAB — URINALYSIS, W/ REFLEX TO CULTURE (INFECTION SUSPECTED)
Bilirubin Urine: NEGATIVE
Glucose, UA: NEGATIVE mg/dL
Ketones, ur: 5 mg/dL — AB
Nitrite: NEGATIVE
Protein, ur: 30 mg/dL — AB
RBC / HPF: >50 RBC/hpf (ref 0–5)
Specific Gravity, Urine: 1.012 (ref 1.005–1.030)
pH: 6 (ref 5.0–8.0)

## 2023-12-29 LAB — CBC WITH DIFFERENTIAL/PLATELET
Abs Immature Granulocytes: 0.04 10*3/uL (ref 0.00–0.07)
Basophils Absolute: 0 10*3/uL (ref 0.0–0.1)
Basophils Relative: 0 %
Eosinophils Absolute: 0 10*3/uL (ref 0.0–0.5)
Eosinophils Relative: 0 %
HCT: 36.8 % (ref 36.0–46.0)
Hemoglobin: 12.4 g/dL (ref 12.0–15.0)
Immature Granulocytes: 0 %
Lymphocytes Relative: 14 %
Lymphs Abs: 1.5 10*3/uL (ref 0.7–4.0)
MCH: 30.2 pg (ref 26.0–34.0)
MCHC: 33.7 g/dL (ref 30.0–36.0)
MCV: 89.8 fL (ref 80.0–100.0)
Monocytes Absolute: 0.9 10*3/uL (ref 0.1–1.0)
Monocytes Relative: 8 %
Neutro Abs: 8.9 10*3/uL — ABNORMAL HIGH (ref 1.7–7.7)
Neutrophils Relative %: 78 %
Platelets: 231 10*3/uL (ref 150–400)
RBC: 4.1 MIL/uL (ref 3.87–5.11)
RDW: 13 % (ref 11.5–15.5)
WBC: 11.4 10*3/uL — ABNORMAL HIGH (ref 4.0–10.5)
nRBC: 0 % (ref 0.0–0.2)

## 2023-12-29 LAB — I-STAT CG4 LACTIC ACID, ED
Lactic Acid, Venous: 0.4 mmol/L — ABNORMAL LOW (ref 0.5–1.9)
Lactic Acid, Venous: 0.7 mmol/L (ref 0.5–1.9)

## 2023-12-29 LAB — TROPONIN I (HIGH SENSITIVITY)
Troponin I (High Sensitivity): 8 ng/L (ref <18)
Troponin I (High Sensitivity): 7 ng/L (ref <18)

## 2023-12-29 LAB — PREGNANCY, URINE: Preg Test, Ur: NEGATIVE

## 2023-12-29 LAB — COMPREHENSIVE METABOLIC PANEL WITH GFR
ALT: 25 U/L (ref 0–44)
AST: 26 U/L (ref 15–41)
Albumin: 3.2 g/dL — ABNORMAL LOW (ref 3.5–5.0)
Alkaline Phosphatase: 47 U/L (ref 38–126)
Anion gap: 11 (ref 5–15)
BUN: 10 mg/dL (ref 6–20)
CO2: 22 mmol/L (ref 22–32)
Calcium: 8.4 mg/dL — ABNORMAL LOW (ref 8.9–10.3)
Chloride: 96 mmol/L — ABNORMAL LOW (ref 98–111)
Creatinine, Ser: 0.92 mg/dL (ref 0.44–1.00)
GFR, Estimated: >60 mL/min (ref >60)
Glucose, Bld: 97 mg/dL (ref 70–99)
Potassium: 3.4 mmol/L — ABNORMAL LOW (ref 3.5–5.1)
Sodium: 129 mmol/L — ABNORMAL LOW (ref 135–145)
Total Bilirubin: 0.5 mg/dL (ref 0.0–1.2)
Total Protein: 6.9 g/dL (ref 6.5–8.1)

## 2023-12-29 LAB — LIPASE, BLOOD: Lipase: 31 U/L (ref 11–51)

## 2023-12-29 MED ORDER — ACETAMINOPHEN 325 MG PO TABS
650.0000 mg | ORAL_TABLET | Freq: Once | ORAL | Status: AC
  Administered 2023-12-30: 650 mg via ORAL
  Filled 2023-12-29: qty 2

## 2023-12-29 MED ORDER — ACETAMINOPHEN 500 MG PO TABS: 1000.0000 mg | ORAL_TABLET | Freq: Once | ORAL | Status: DC

## 2023-12-29 MED ORDER — OXYCODONE-ACETAMINOPHEN 5-325 MG PO TABS
1.0000 | ORAL_TABLET | Freq: Once | ORAL | Status: AC
  Administered 2023-12-29: 1 via ORAL
  Filled 2023-12-29: qty 1

## 2023-12-29 MED ORDER — SODIUM CHLORIDE 0.9 % IV SOLN
1.0000 g | INTRAVENOUS | Status: DC
  Administered 2023-12-29: 1 g via INTRAVENOUS
  Filled 2023-12-29: qty 10

## 2023-12-29 MED ORDER — IOHEXOL 350 MG/ML SOLN
75.0000 mL | Freq: Once | INTRAVENOUS | Status: AC | PRN
  Administered 2023-12-29: 75 mL via INTRAVENOUS

## 2023-12-29 NOTE — ED Triage Notes (Signed)
 Pt reports that she was diagnosed with pyelonephritis yesterday at [facility]. Now complains of CP and intermittent SHOB x1 day, migraine x2 days.

## 2023-12-29 NOTE — ED Provider Notes (Addendum)
 [place] EMERGENCY DEPARTMENT AT [facility] Provider Note   CSN: [id number] Arrival date & time: 12/29/23  2020     History  Chief Complaint  Patient presents with   Chest Pain   Migraine    Allison Nelson is a 48 y.o. female.  48 year old female presents with several days of right-sided flank pain.  Seen in urgent care diagnosed with UTI.  Was given IM Rocephin  along with ciprofloxacin which she has had 2 doses.  She today has noted chest discomfort that is worse with taking deep breath and on her right side.  Patient has been using OTCs at home to manage her fever.  She denies any severe productive cough.  No vomiting or diarrhea noted.  Unable to look at the urine results from yesterday.       Home Medications Prior to Admission medications   Medication Sig Start Date End Date Taking? Authorizing Provider  AIMOVIG 140 MG/ML SOAJ  12/21/19   [provider]  amphetamine-dextroamphetamine (ADDERALL) 20 MG tablet Take 20 mg by mouth 3 (three) times daily. 07/22/17   [provider]  ARIPiprazole (ABILIFY) 5 MG tablet Take 5 mg by mouth daily.    [provider]  cetirizine (ZYRTEC) 10 MG tablet Take 10 mg by mouth daily.    [provider]  cyclobenzaprine (FLEXERIL) 10 MG tablet Take 10 mg by mouth 3 (three) times daily as needed for muscle spasms.  09/19/14   [provider]  drospirenone-ethinyl estradiol  (NIKKI) 3-0.02 MG tablet Take 1 tablet by mouth daily. 07/11/17   [provider]  estradiol  (ESTRACE ) 0.1 MG/GM vaginal cream Apply pea size M, W and Friday at bedtime 10/09/19   Dustin Gimenez, MD  gabapentin (NEURONTIN) 300 MG capsule Take 300 mg by mouth daily. 07/26/17   [provider]  HYDROcodone-acetaminophen  (NORCO) 10-325 MG tablet Take 1 tablet by mouth 4 (four) times daily as needed. 10/28/19   [provider]  hydrOXYzine (ATARAX/VISTARIL) 25 MG tablet Take 25 mg by mouth every  6 (six) hours as needed for anxiety.     [provider]  hyoscyamine (LEVSIN, ANASPAZ) 0.125 MG tablet Take 0.125 mg by mouth every 6 (six) hours as needed for cramping.  07/28/17   [provider]  meloxicam (MOBIC) 15 MG tablet Take 15 mg by mouth daily. 07/12/17   [provider]  Multiple Vitamin (MULTI-VITAMINS) TABS Take 1 tablet by mouth daily.    [provider]  promethazine (PHENERGAN) 25 MG tablet  09/16/19   [provider]  sertraline (ZOLOFT) 100 MG tablet Take 100 mg by mouth 2 (two) times daily.    [provider]  SUMAtriptan (IMITREX) 100 MG tablet Take 100 mg by mouth every 8 (eight) hours as needed for migraine or headache.  07/11/17   [provider]  SUMAtriptan 6 MG/0.5ML SOAJ Inject 0.5 mLs as directed daily as needed (migraine).  07/11/17   [provider]  Vitamin D, Ergocalciferol, (DRISDOL) 50000 units CAPS capsule Take 50,000 Units by mouth every Thursday. 06/08/17   [provider]  zolpidem  (AMBIEN ) 10 MG tablet Take 10 mg by mouth at bedtime as needed for sleep.  05/20/17   [provider]      Allergies    Patient has no known allergies.    Review of Systems   Review of Systems  All other systems reviewed and are negative.   Physical Exam Updated Vital Signs BP 116/68 (BP  Location: Left Arm)   Pulse (!) 112   Temp (!) 101.7 F (38.7 C) (Oral)   Resp 16   Ht 1.676 m (5\' 6" )   Wt 79.4 kg   LMP 12/08/2023 (Approximate)   SpO2 99%   BMI 28.25 kg/m  Physical Exam Vitals and nursing note reviewed.  Constitutional:      General: She is not in acute distress.    Appearance: Normal appearance. She is well-developed. She is not toxic-appearing.  HENT:     Head: Normocephalic and atraumatic.  Eyes:     General: Lids are normal.     Conjunctiva/sclera: Conjunctivae normal.     Pupils: Pupils are equal, round, and reactive to light.  Neck:     Thyroid: No thyroid mass.      Trachea: No tracheal deviation.  Cardiovascular:     Rate and Rhythm: Regular rhythm. Tachycardia present.     Heart sounds: Normal heart sounds. No murmur heard.    No gallop.  Pulmonary:     Effort: Pulmonary effort is normal. No respiratory distress.     Breath sounds: Normal breath sounds. No stridor. No decreased breath sounds, wheezing, rhonchi or rales.  Abdominal:     General: There is no distension.     Palpations: Abdomen is soft.     Tenderness: There is no abdominal tenderness. There is right CVA tenderness. There is no guarding or rebound.    Musculoskeletal:        General: No tenderness. Normal range of motion.     Cervical back: Normal range of motion and neck supple.  Skin:    General: Skin is warm and dry.     Findings: No abrasion or rash.  Neurological:     Mental Status: She is alert and oriented to person, place, and time. Mental status is at baseline.     GCS: GCS eye subscore is 4. GCS verbal subscore is 5. GCS motor subscore is 6.     Cranial Nerves: No cranial nerve deficit.     Sensory: No sensory deficit.     Motor: Motor function is intact.  Psychiatric:        Attention and Perception: Attention normal.        Speech: Speech normal.        Behavior: Behavior normal.     ED Results / Procedures / Treatments   Labs (all labs ordered are listed, but only abnormal results are displayed) Labs Reviewed  CBC WITH DIFFERENTIAL/PLATELET - Abnormal; Notable for the following components:      Result Value   WBC 11.4 (*)    Neutro Abs 8.9 (*)    All other components within normal limits  URINALYSIS, W/ REFLEX TO CULTURE (INFECTION SUSPECTED) - Abnormal; Notable for the following components:   APPearance HAZY (*)    Hgb urine dipstick LARGE (*)    Ketones, ur 5 (*)    Protein, ur 30 (*)    Leukocytes,Ua SMALL (*)    Bacteria, UA RARE (*)    All other components within normal limits  I-STAT CG4 LACTIC ACID, ED - Abnormal; Notable for the  following components:   Lactic Acid, Venous 0.4 (*)    All other components within normal limits  URINE CULTURE  PREGNANCY, URINE  COMPREHENSIVE METABOLIC PANEL WITH GFR  LIPASE, BLOOD  HCG, SERUM, QUALITATIVE  TROPONIN I (HIGH SENSITIVITY)    EKG None  Radiology No results found.  Procedures Procedures    Medications Ordered  in ED Medications  oxyCODONE -acetaminophen  (PERCOCET/ROXICET) 5-325 MG per tablet 1 tablet (1 tablet Oral Given 12/29/23 2103)    ED Course/ Medical Decision Making/ A&P                                 Medical Decision Making Amount and/or Complexity of Data Reviewed Labs: ordered. Radiology: ordered.  Risk OTC drugs. Prescription drug management. Decision regarding hospitalization.   Febrile here and treat with antipyretics.  Urinalysis consistent with infection.  She is started on IV Rocephin .  Mild leukocytosis noted on patient's CBC.  Concern for possible infected kidney stone versus pyelonephritis and abdominal CT performed and consistent with pyelonephritis.  Due to patient already being on Cipro and continue to spike fevers and feeling malaise patient will require admission.  Husband at bedside and informed of plan.  Will consult hospitalist team        Final Clinical Impression(s) / ED Diagnoses Final diagnoses:  None    Rx / DC Orders ED Discharge Orders     None         Lind Repine, MD 12/29/23 Gladystine Lamprey    Lind Repine, MD 12/29/23 [phone]

## 2023-12-29 NOTE — ED Provider Triage Note (Signed)
 Emergency Medicine Provider Triage Evaluation Note  Allison Nelson , a 48 y.o. female  was evaluated in triage.  Pt complains of after being diagnosed with kidney infection yesterday in urgent care.  Patient has taken 2 doses of ciprofloxacin and was given a dose of IM Rocephin  yesterday however still has right flank pain and now has developed chest pain which patient thinks could be anxiety.  Patient has no cardiac history and does not have any associated symptoms with the chest pain.  Patient denies nausea vomiting but states that yesterday the urgent care said that her urine was positive for everything.  Review of Systems  Positive:  Negative:   Physical Exam  BP 116/68 (BP Location: Left Arm)   Pulse (!) 112   Temp (!) 101.7 F (38.7 C) (Oral)   Resp 16   Ht 5\' 6"  (1.676 m)   Wt 79.4 kg   LMP 12/08/2023 (Approximate)   SpO2 99%   BMI 28.25 kg/m  Gen:   Awake, no distress   Resp:  Normal effort  MSK:   Moves extremities without difficulty  Other:  Right CVA tenderness, no abdominal tenderness noted, lungs clear to auscultation bilaterally  Medical Decision Making  Medically screening exam initiated at 8:53 PM.  Appropriate orders placed.  Allison Nelson was informed that the remainder of the evaluation will be completed by another provider, this initial triage assessment does not replace that evaluation, and the importance of remaining in the ED until their evaluation is complete.  Workup initiated, patient febrile and tachycardic here and with reported history of pyelonephritis does meet sepsis criteria, will obtain labs and imaging.  Patient given pain meds.   Denese Finn, [place] 12/29/23 2054

## 2023-12-30 ENCOUNTER — Encounter (HOSPITAL_COMMUNITY): Payer: Self-pay | Admitting: Family Medicine

## 2023-12-30 ENCOUNTER — Inpatient Hospital Stay (HOSPITAL_COMMUNITY)

## 2023-12-30 DIAGNOSIS — I10 Essential (primary) hypertension: Secondary | ICD-10-CM | POA: Diagnosis present

## 2023-12-30 DIAGNOSIS — N12 Tubulo-interstitial nephritis, not specified as acute or chronic: Principal | ICD-10-CM | POA: Diagnosis present

## 2023-12-30 DIAGNOSIS — F330 Major depressive disorder, recurrent, mild: Secondary | ICD-10-CM | POA: Diagnosis not present

## 2023-12-30 DIAGNOSIS — E871 Hypo-osmolality and hyponatremia: Secondary | ICD-10-CM | POA: Diagnosis present

## 2023-12-30 DIAGNOSIS — F419 Anxiety disorder, unspecified: Secondary | ICD-10-CM | POA: Diagnosis not present

## 2023-12-30 LAB — CBC
HCT: 36 % (ref 36.0–46.0)
Hemoglobin: 12 g/dL (ref 12.0–15.0)
MCH: 29.5 pg (ref 26.0–34.0)
MCHC: 33.3 g/dL (ref 30.0–36.0)
MCV: 88.5 fL (ref 80.0–100.0)
Platelets: 224 10*3/uL (ref 150–400)
RBC: 4.07 MIL/uL (ref 3.87–5.11)
RDW: 13.2 % (ref 11.5–15.5)
WBC: 9.5 10*3/uL (ref 4.0–10.5)
nRBC: 0 % (ref 0.0–0.2)

## 2023-12-30 LAB — HIV ANTIBODY (ROUTINE TESTING W REFLEX): HIV Screen 4th Generation wRfx: NONREACTIVE

## 2023-12-30 LAB — MAGNESIUM: Magnesium: 2 mg/dL (ref 1.7–2.4)

## 2023-12-30 LAB — BASIC METABOLIC PANEL WITH GFR
Anion gap: 10 (ref 5–15)
BUN: 6 mg/dL (ref 6–20)
CO2: 22 mmol/L (ref 22–32)
Calcium: 8 mg/dL — ABNORMAL LOW (ref 8.9–10.3)
Chloride: 104 mmol/L (ref 98–111)
Creatinine, Ser: 0.83 mg/dL (ref 0.44–1.00)
GFR, Estimated: >60 mL/min (ref >60)
Glucose, Bld: 95 mg/dL (ref 70–99)
Potassium: 3.4 mmol/L — ABNORMAL LOW (ref 3.5–5.1)
Sodium: 136 mmol/L (ref 135–145)

## 2023-12-30 LAB — HCG, SERUM, QUALITATIVE: Preg, Serum: NEGATIVE

## 2023-12-30 MED ORDER — ENOXAPARIN SODIUM 40 MG/0.4ML IJ SOSY
40.0000 mg | PREFILLED_SYRINGE | INTRAMUSCULAR | Status: DC
  Administered 2023-12-30 – 2023-12-31 (×2): 40 mg via SUBCUTANEOUS
  Filled 2023-12-30 (×2): qty 0.4

## 2023-12-30 MED ORDER — POTASSIUM CHLORIDE CRYS ER 20 MEQ PO TBCR
20.0000 meq | EXTENDED_RELEASE_TABLET | Freq: Once | ORAL | Status: AC
  Administered 2023-12-30: 20 meq via ORAL
  Filled 2023-12-30: qty 1

## 2023-12-30 MED ORDER — SODIUM CHLORIDE 0.9 % IV SOLN: INTRAVENOUS | Status: DC

## 2023-12-30 MED ORDER — SODIUM CHLORIDE 0.9 % IV SOLN: INTRAVENOUS | Status: AC

## 2023-12-30 MED ORDER — CYCLOBENZAPRINE HCL 10 MG PO TABS
10.0000 mg | ORAL_TABLET | Freq: Every day | ORAL | Status: DC
  Administered 2023-12-30: 10 mg via ORAL
  Filled 2023-12-30: qty 1

## 2023-12-30 MED ORDER — SODIUM CHLORIDE 0.9% FLUSH
3.0000 mL | Freq: Two times a day (BID) | INTRAVENOUS | Status: DC
  Administered 2023-12-30 – 2023-12-31 (×3): 3 mL via INTRAVENOUS

## 2023-12-30 MED ORDER — ARIPIPRAZOLE 5 MG PO TABS
5.0000 mg | ORAL_TABLET | Freq: Every day | ORAL | Status: DC
  Administered 2023-12-30 – 2023-12-31 (×2): 5 mg via ORAL
  Filled 2023-12-30 (×2): qty 1

## 2023-12-30 MED ORDER — HYOSCYAMINE SULFATE 0.125 MG SL SUBL
0.1250 mg | SUBLINGUAL_TABLET | Freq: Four times a day (QID) | SUBLINGUAL | Status: DC | PRN
  Administered 2023-12-30: 0.125 mg via ORAL
  Filled 2023-12-30 (×2): qty 1

## 2023-12-30 MED ORDER — SERTRALINE HCL 100 MG PO TABS
200.0000 mg | ORAL_TABLET | Freq: Every day | ORAL | Status: DC
  Administered 2023-12-30 – 2023-12-31 (×2): 200 mg via ORAL
  Filled 2023-12-30 (×2): qty 2

## 2023-12-30 MED ORDER — SUMATRIPTAN 20 MG/ACT NA SOLN: 20.0000 mg | NASAL | Status: DC | PRN

## 2023-12-30 MED ORDER — ONDANSETRON HCL 4 MG PO TABS: 4.0000 mg | ORAL_TABLET | Freq: Four times a day (QID) | ORAL | Status: DC | PRN

## 2023-12-30 MED ORDER — PANTOPRAZOLE SODIUM 40 MG PO TBEC
40.0000 mg | DELAYED_RELEASE_TABLET | Freq: Every day | ORAL | Status: DC
  Administered 2023-12-30 – 2023-12-31 (×2): 40 mg via ORAL
  Filled 2023-12-30 (×2): qty 1

## 2023-12-30 MED ORDER — MEROPENEM 1 G IV SOLR
1.0000 g | Freq: Three times a day (TID) | INTRAVENOUS | Status: DC
  Administered 2023-12-30 – 2023-12-31 (×5): 1 g via INTRAVENOUS
  Filled 2023-12-30 (×7): qty 20

## 2023-12-30 MED ORDER — ONDANSETRON HCL 4 MG/2ML IJ SOLN: 4.0000 mg | Freq: Four times a day (QID) | INTRAMUSCULAR | Status: DC | PRN

## 2023-12-30 MED ORDER — SUMATRIPTAN SUCCINATE 100 MG PO TABS
100.0000 mg | ORAL_TABLET | Freq: Two times a day (BID) | ORAL | Status: DC | PRN
  Administered 2023-12-30 – 2023-12-31 (×2): 100 mg via ORAL
  Filled 2023-12-30 (×3): qty 1

## 2023-12-30 MED ORDER — OXYCODONE HCL 5 MG PO TABS
5.0000 mg | ORAL_TABLET | ORAL | Status: DC | PRN
  Administered 2023-12-30 – 2023-12-31 (×3): 5 mg via ORAL
  Filled 2023-12-30 (×3): qty 1

## 2023-12-30 MED ORDER — ATOGEPANT 60 MG PO TABS
60.0000 mg | ORAL_TABLET | Freq: Every day | ORAL | Status: DC
Start: 2023-12-30 — End: 2023-12-30

## 2023-12-30 MED ORDER — BISACODYL 5 MG PO TBEC: 5.0000 mg | DELAYED_RELEASE_TABLET | Freq: Every day | ORAL | Status: DC | PRN

## 2023-12-30 MED ORDER — ACETAMINOPHEN 325 MG PO TABS: 650.0000 mg | ORAL_TABLET | Freq: Four times a day (QID) | ORAL | Status: DC | PRN

## 2023-12-30 MED ORDER — GABAPENTIN 300 MG PO CAPS
600.0000 mg | ORAL_CAPSULE | Freq: Three times a day (TID) | ORAL | Status: DC
  Administered 2023-12-30 – 2023-12-31 (×5): 600 mg via ORAL
  Filled 2023-12-30 (×5): qty 2

## 2023-12-30 MED ORDER — ACETAMINOPHEN 650 MG RE SUPP: 650.0000 mg | Freq: Four times a day (QID) | RECTAL | Status: DC | PRN

## 2023-12-30 MED ORDER — POLYETHYLENE GLYCOL 3350 17 G PO PACK: 17.0000 g | PACK | Freq: Every day | ORAL | Status: DC | PRN

## 2023-12-30 MED ORDER — HYDROMORPHONE HCL 1 MG/ML IJ SOLN: 0.5000 mg | INTRAMUSCULAR | Status: DC | PRN

## 2023-12-30 MED ORDER — PROPRANOLOL HCL 20 MG PO TABS
20.0000 mg | ORAL_TABLET | Freq: Two times a day (BID) | ORAL | Status: DC
  Administered 2023-12-30 – 2023-12-31 (×4): 20 mg via ORAL
  Filled 2023-12-30 (×4): qty 1

## 2023-12-30 MED ORDER — LACTATED RINGERS IV BOLUS
1000.0000 mL | Freq: Once | INTRAVENOUS | Status: AC
  Administered 2023-12-30: 1000 mL via INTRAVENOUS

## 2023-12-30 MED ORDER — HYDROXYZINE HCL 25 MG PO TABS
25.0000 mg | ORAL_TABLET | Freq: Four times a day (QID) | ORAL | Status: DC | PRN
Start: 2023-12-30 — End: 2023-12-31

## 2023-12-30 MED ORDER — ZOLPIDEM TARTRATE 5 MG PO TABS
5.0000 mg | ORAL_TABLET | Freq: Every day | ORAL | Status: DC
  Administered 2023-12-30: 5 mg via ORAL
  Filled 2023-12-30: qty 1

## 2023-12-30 NOTE — H&P (Signed)
 History and Physical    Coreen Petriello JXB:[id number] DOB: 20-Jul-1976 DOA: 12/29/2023  PCP: Audley Bleak, MD   Patient coming from: Home   Chief Complaint: Right flank and abdominal pain, fever, chills   HPI: Masae Comi is a 48 y.o. female with medical history significant for depression, anxiety, insomnia, IBS, chronic migraine, and hypertension who presents with right flank and abdominal pain, fever, and chills.  Patient developed pain in her right flank and abdomen that woke her from sleep the night of 12/27/2023.  She was seen at an urgent care the following day where she was diagnosed with pyelonephritis, given IM Rocephin , and sent home with ciprofloxacin.  She continues to have fevers and pain despite this.  ED Course: Upon arrival to the ED, patient is found to be febrile to 38.7 C and mildly tachycardic with stable BP.  Labs are most notable for sodium 129, potassium 3.4, creatinine 0.92, WBC 11,400, normal lactate x 2, and normal troponin x 2.  CT is concerning for right-sided pyelonephritis and heterogenous uterus with possible fibroid.  Urine culture was collected in the ED and the patient was treated with a 1 g IV Rocephin , acetaminophen , and Percocet.  Review of Systems:  All other systems reviewed and apart from HPI, are negative.  Past Medical History:  Diagnosis Date   Anxiety    Blood type, Rh negative    BV (bacterial vaginosis)    Depression    GBS (group B streptococcus) infection    Hx: UTI (urinary tract infection)    IBS (irritable bowel syndrome)     Past Surgical History:  Procedure Laterality Date   BREAST LUMPECTOMY  10/2004   BREAST SURGERY  11/2008   Breast Implants   BUNIONECTOMY  09/2004   right bunion   FOOT SURGERY  09/2005   remove pins from previous surgery   GANGLION CYST EXCISION  05/17/2001   Right wrist   KNEE SURGERY     Torn meniscus   LUMBAR LAMINECTOMY  09/2002    Social History:   reports that she has never  smoked. She has never used smokeless tobacco. She reports that she does not currently use alcohol. She reports that she does not use drugs.  No Known Allergies  Family History  Problem Relation Age of Onset   Cancer Paternal Grandfather        LUNG   Heart disease Paternal Grandmother    Hyperlipidemia Father      Prior to Admission medications   Medication Sig Start Date End Date Taking? Authorizing Provider  AIMOVIG 140 MG/ML SOAJ Inject 140 mg into the skin every 30 (thirty) days. 12/21/19  Yes [provider]  amphetamine-dextroamphetamine (ADDERALL XR) 30 MG 24 hr capsule Take 30 mg by mouth 2 (two) times daily with breakfast and lunch.   Yes [provider]  ARIPiprazole (ABILIFY) 5 MG tablet Take 5 mg by mouth daily.   Yes [provider]  atomoxetine (STRATTERA) 80 MG capsule Take 80 mg by mouth at bedtime.   Yes [provider]  celecoxib (CELEBREX) 100 MG capsule Take 100 mg by mouth 2 (two) times daily.   Yes [provider]  cetirizine (ZYRTEC) 10 MG tablet Take 10 mg by mouth daily.   Yes [provider]  ciprofloxacin (CIPRO) 500 MG tablet Take 500 mg by mouth 2 (two) times daily.   Yes [provider]  cyclobenzaprine (FLEXERIL) 10 MG tablet Take 10 mg by mouth at bedtime.  09/19/14  Yes [provider]  ENBREL SURECLICK 50 MG/ML injection Inject 50 mg into the skin once a week. Thursday 07/11/23  Yes [provider]  gabapentin (NEURONTIN) 300 MG capsule Take 600 mg by mouth 3 (three) times daily. 07/26/17  Yes [provider]  HYDROcodone-acetaminophen  (NORCO) 10-325 MG tablet Take 1 tablet by mouth 4 (four) times daily as needed for moderate pain (pain score 4-6). 10/28/19  Yes [provider]  hydrOXYzine (ATARAX/VISTARIL) 25 MG tablet Take 25 mg by mouth every 6 (six) hours as needed for anxiety.    Yes [provider]  hyoscyamine (LEVSIN, ANASPAZ) 0.125 MG tablet  Take 0.125 mg by mouth every 6 (six) hours as needed for cramping.  07/28/17  Yes [provider]  JUNEL FE 1.5/30 1.5-30 MG-MCG tablet Take 1 tablet by mouth daily.   Yes [provider]  magnesium oxide (MAG-OX) 400 (240 Mg) MG tablet Take 400 mg by mouth daily.   Yes [provider]  MOUNJARO 12.5 MG/0.5ML Pen Inject 12.5 mg into the skin once a week. Thursday 12/14/23  Yes [provider]  Multiple Vitamin (MULTI-VITAMINS) TABS Take 1 tablet by mouth daily.   Yes [provider]  omeprazole (PRILOSEC) 20 MG capsule Take 20 mg by mouth daily. 07/17/23  Yes [provider]  promethazine (PHENERGAN) 25 MG tablet Take 25 mg by mouth daily as needed for nausea. 09/16/19  Yes [provider]  propranolol (INDERAL) 20 MG tablet Take 20 mg by mouth 2 (two) times daily with breakfast and lunch.   Yes [provider]  QULIPTA 60 MG TABS Take 60 mg by mouth daily.   Yes [provider]  sertraline (ZOLOFT) 100 MG tablet Take 200 mg by mouth daily.   Yes [provider]  SUMAtriptan (IMITREX) 100 MG tablet Take 100 mg by mouth every 8 (eight) hours as needed for migraine or headache.  07/11/17  Yes [provider]  SUMAtriptan 6 MG/0.5ML SOAJ Inject 0.5 mLs as directed daily as needed (migraine).  07/11/17  Yes [provider]  UBRELVY 100 MG TABS Take 100 mg by mouth See admin instructions. Take 1 tablet by mouth for the onset of migraine, second dose may be taken 2 hours after initial dose.   Yes [provider]  zolpidem  (AMBIEN ) 10 MG tablet Take 5 mg by mouth at bedtime. 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 Patient not taking: Reported on 12/30/2023    [provider]    Physical Exam: Vitals:   12/29/23 2238 12/29/23 2239 12/29/23 2352 12/30/23 0009  BP: 107/67  106/71   Pulse: (!)  103  97   Resp:   16   Temp:  98.8 F (37.1 C) 98.3 F (36.8 C) 98.3 F (36.8 C)  TempSrc:   Oral Oral  SpO2: 100%  100%   Weight:      Height:        Constitutional: NAD, calm  Eyes: PERTLA, lids and conjunctivae normal ENMT: Mucous membranes are moist. Posterior pharynx clear of any exudate or lesions.   Neck: supple, no masses  Respiratory: no wheezing, no crackles. No accessory muscle use.  Cardiovascular: S1 & S2 heard, regular rate and rhythm. No extremity edema.   Abdomen: Soft, tender on right. Bowel sounds active.  Musculoskeletal: no clubbing / cyanosis. No joint deformity upper and lower extremities.  Skin: no significant rashes, lesions, ulcers. Warm, dry, well-perfused. Neurologic: CN 2-12 grossly intact. Moving all extremities. Alert and oriented.  Psychiatric: Pleasant. Cooperative.    Labs and Imaging on Admission: I have personally reviewed following labs and imaging studies  CBC: Recent Labs  Lab 12/29/23 2057  WBC 11.4*  NEUTROABS 8.9*  HGB 12.4  HCT 36.8  MCV 89.8  PLT 231   Basic Metabolic Panel: Recent Labs  Lab 12/29/23 2057  NA 129*  K 3.4*  CL 96*  CO2 22  GLUCOSE 97  BUN 10  CREATININE 0.92  CALCIUM 8.4*   GFR: Estimated Creatinine Clearance: 77.7 mL/min (by C-G formula based on SCr of 0.92 mg/dL). Liver Function Tests: Recent Labs  Lab 12/29/23 2057  AST 26  ALT 25  ALKPHOS 47  BILITOT 0.5  PROT 6.9  ALBUMIN 3.2*   Recent Labs  Lab 12/29/23 2057  LIPASE 31   No results for input(s): "AMMONIA" in the last 168 hours. Coagulation Profile: No results for input(s): "INR", "PROTIME" in the last 168 hours. Cardiac Enzymes: No results for input(s): "CKTOTAL", "CKMB", "CKMBINDEX", "TROPONINI" in the last 168 hours. BNP (last 3 results) No results for input(s): "PROBNP" in the last 8760 hours. HbA1C: No results for input(s): "HGBA1C" in the last 72 hours. CBG: No results for input(s): "GLUCAP" in the last 168  hours. Lipid Profile: No results for input(s): "CHOL", "HDL", "LDLCALC", "TRIG", "CHOLHDL", "LDLDIRECT" in the last 72 hours. Thyroid Function Tests: No results for input(s): "TSH", "T4TOTAL", "FREET4", "T3FREE", "THYROIDAB" in the last 72 hours. Anemia Panel: No results for input(s): "VITAMINB12", "FOLATE", "FERRITIN", "TIBC", "IRON", "RETICCTPCT" in the last 72 hours. Urine analysis:    Component Value Date/Time   COLORURINE YELLOW 12/29/2023 2100   APPEARANCEUR HAZY (A) 12/29/2023 2100   APPEARANCEUR Clear 11/06/2019 1002   LABSPEC 1.012 12/29/2023 2100   PHURINE 6.0 12/29/2023 2100   GLUCOSEU NEGATIVE 12/29/2023 2100   HGBUR LARGE (A) 12/29/2023 2100   BILIRUBINUR NEGATIVE 12/29/2023 2100   BILIRUBINUR Negative 11/06/2019 1002   KETONESUR 5 (A) 12/29/2023 2100   PROTEINUR 30 (A) 12/29/2023 2100   UROBILINOGEN 0.2 07/04/2019 1556   UROBILINOGEN 0.2 10/22/2014 0339   NITRITE NEGATIVE 12/29/2023 2100   LEUKOCYTESUR SMALL (A) 12/29/2023 2100   Sepsis Labs: @LABRCNTIP (procalcitonin:4,lacticidven:4) )No results found for this or any previous visit (from the past 240 hours).   Radiological Exams on Admission: CT ABDOMEN PELVIS W CONTRAST Result Date: 12/29/2023 CLINICAL DATA:  Acute abdominal pain. EXAM: CT ABDOMEN AND PELVIS WITH CONTRAST TECHNIQUE: Multidetector CT imaging of the abdomen and pelvis was performed using the standard protocol following bolus administration of intravenous contrast. RADIATION DOSE REDUCTION: This exam was performed according to the departmental dose-optimization program which includes automated exposure control, adjustment of the mA and/or kV according to patient size and/or use of iterative reconstruction technique. CONTRAST:  75mL OMNIPAQUE  IOHEXOL  350 MG/ML SOLN COMPARISON:  CT renal stone 03/30/2019. FINDINGS: Lower chest: No acute abnormality. Hepatobiliary: The liver is mildly enlarged. No focal liver lesions are seen. Gallbladder and bile ducts are  within normal limits. Pancreas: Unremarkable. No pancreatic ductal dilatation or surrounding inflammatory changes. Spleen: Normal in size without focal abnormality. Adrenals/Urinary Tract: Right renal atrophy with multiple areas of scarring again noted. There is some vague patchy areas of decreased attenuation in the inferior pole the right kidney. There are punctate calcifications in the right kidney. Left kidney appears within normal limits. There is no hydronephrosis. Adrenal glands and bladder are within normal  limits. Stomach/Bowel: Stomach is within normal limits. Appendix appears normal. No evidence of bowel wall thickening, distention, or inflammatory changes. There is a large amount of stool in the ascending colon and transverse colon. Vascular/Lymphatic: No significant vascular findings are present. No enlarged abdominal or pelvic lymph nodes. Reproductive: The uterus is heterogeneous. There is a central rounded mildly hyperdense area measuring up to 18 mm. Adnexa are within normal limits. Other: No abdominal wall hernia or abnormality. No abdominopelvic ascites. Musculoskeletal: There are degenerative changes at L4-L5. No acute osseous abnormality. IMPRESSION: 1. Vague patchy areas of decreased attenuation in the inferior pole of the right kidney worrisome for pyelonephritis. 2. Right renal atrophy with multiple areas of scarring. 3. Nonobstructing right renal calculi. 4. Mild hepatomegaly. 5. Large amount of stool in the ascending colon and transverse colon. 6. Heterogeneous uterus with a central rounded mildly hyperdense area measuring up to 18 mm. This may represent a fibroid. This can be further evaluated with pelvic ultrasound. Electronically Signed   By: Tyron Gallon M.D.   On: 12/29/2023 23:33   DG Chest Port 1 View Result Date: 12/29/2023 CLINICAL DATA:  Chest pain and intermittent shortness of breath for 1 day, pyelonephritis EXAM: PORTABLE CHEST 1 VIEW COMPARISON:  11/05/2010 FINDINGS: The  heart size and mediastinal contours are within normal limits. Both lungs are clear. The visualized skeletal structures are unremarkable. IMPRESSION: No active disease. Electronically Signed   By: Bobbye Burrow M.D.   On: 12/29/2023 21:23    EKG: Independently reviewed. Sinus tachycardia, rate 114, PVCs.   Assessment/Plan   1. Acute pyelonephritis  - Febrile and tachycardic despite outpatient treatment with IM Rocephin  and ciprofloxacin, had hx of ESBL in urine  - Urine culture collected in ED  - Culture blood, change antibiotics to meropenem for now, follow cultures and clinical course    2. Uterine lesion  - Suspected fibroid seen on CT in ED  - Check pelvic US     3. Hyponatremia  - Serum sodium is 129 in setting of hypovolemia  - Continue IVF hydration and repeat chem panel in am   4. Depression; anxiety; insomnia  - Abilify, Zoloft, as-needed Atarax, Ambien    5. Chronic migraine  - Atogepant and as-needed Imitrex    DVT prophylaxis: Lovenox  Code Status: Full  Level of Care: Level of care: Telemetry Medical Family Communication: None present  Disposition Plan:  Patient is from: home  Anticipated d/c is to: Home  Anticipated d/c date is: 01/02/24  Patient currently: Pending cultures, clinical improvement  Consults called: None  Admission status: Inpatient     Walton Guppy, MD [facility]  12/30/2023, 2:06 AM

## 2023-12-30 NOTE — Progress Notes (Signed)
 PROGRESS NOTE  Allison Nelson  UJW:[id number] DOB: 09/12/1975 DOA: 12/29/2023 PCP: Audley Bleak, MD   Brief Narrative: Patient is a 48 year old female with history of depression, anxiety, insomnia, IBS, migraine, hypertension, ankylosing spondylitis who presented with right flank pain, abdominal pain, fever, chills at home.  She was seen and found urgent care where she was diagnosed with pyelonephritis and was given IM Rocephin  and was sent home to ciprofloxacin.  She continued to have fever and pain despite taking oral antibiotic.  On presentation, she was found to be febrile, tachycardic with a stable blood pressure.  Lab work showed sodium of 129, potassium 3.4, WBC count of 11.4, normal lactate.  CT abdomen was concerning for right-sided pyonephritis.  She had a history of ESBL UTI.  Started on meropenem.  Follow-up urine culture, blood culture  Assessment & Plan:  Principal Problem:   Pyelonephritis Active Problems:   Chronic headache   Constipation   Mild episode of recurrent major depressive disorder (HCC)   Hyponatremia   Anxiety   Essential hypertension   Acute pyelonephritis: Failed outpatient antibiotic treatment.  Presented with right flank pain, fever, abdominal pain, chills.  CT abdomen was concerning for right-sided pyonephritis.  She had a history of ESBL UTI.  Started on meropenem.  Follow-up urine culture, blood culture. Afebrile this morning  Suspected Uterine fibroid: Seen on CT.  Pelvic ultrasound showed   endometrial mass versus submucosal fibroid. She need to follow-up with GYN as an outpatient  Depression/anxiety/insomnia: On Abilify, Zoloft, as needed Atarax, Ambien   Chronic migraine: On propanolol and imitrex.  Complains of headache today.Added sumatriptan nasal spray        DVT prophylaxis:enoxaparin (LOVENOX) injection 40 mg Start: 12/30/23 1400     Code Status: Full Code  Family Communication: None at bedside  Patient  status:Inpatient  Patient is from :Home  Anticipated discharge FA:OZHY  Estimated DC date:1-2 days, waiting for blood culture/ urine culture report   Consultants: None  Procedures: None  Antimicrobials:  Anti-infectives (From admission, onward)    Start     Dose/Rate Route Frequency Ordered Stop   12/30/23 0100  meropenem (MERREM) 1 g in sodium chloride  0.9 % 100 mL IVPB        1 g 200 mL/hr over 30 Minutes Intravenous Every 8 hours 12/30/23 0019     12/29/23 2200  cefTRIAXone  (ROCEPHIN ) 1 g in sodium chloride  0.9 % 100 mL IVPB  Status:  Discontinued        1 g 200 mL/hr over 30 Minutes Intravenous Every 24 hours 12/29/23 2147 12/30/23 0006       Subjective: Patient seen and examined at bedside today.  Afebrile this morning.  Blood pressure stable.  She still have some right flank discomfort but feeling better after admission.  Complains of headache  Objective: Vitals:   12/30/23 0209 12/30/23 0353 12/30/23 0354 12/30/23 0422  BP: 109/61  110/73 106/61  Pulse: 96  96 98  Resp: 18  17 18   Temp:  98.4 F (36.9 C)  98.5 F (36.9 C)  TempSrc:  Oral  Oral  SpO2: 100%  100% 97%  Weight:      Height:        Intake/Output Summary (Last 24 hours) at 12/30/2023 0737 Last data filed at 12/30/2023 0431 Gross per 24 hour  Intake 1150.28 ml  Output --  Net 1150.28 ml   Filed Weights   12/29/23 2044  Weight: 79.4 kg    Examination:  General exam: Overall  comfortable, not in distress HEENT: PERRL Respiratory system:  no wheezes or crackles  Cardiovascular system: S1 & S2 heard, RRR.  Gastrointestinal system: Abdomen is nondistended, soft.  Tender right flank Central nervous system: Alert and oriented Extremities: No edema, no clubbing ,no cyanosis Skin: No rashes, no ulcers,no icterus     Data Reviewed: I have personally reviewed following labs and imaging studies  CBC: Recent Labs  Lab 12/29/23 2057  WBC 11.4*  NEUTROABS 8.9*  HGB 12.4  HCT 36.8  MCV 89.8   PLT 231   Basic Metabolic Panel: Recent Labs  Lab 12/29/23 2057  NA 129*  K 3.4*  CL 96*  CO2 22  GLUCOSE 97  BUN 10  CREATININE 0.92  CALCIUM 8.4*     No results found for this or any previous visit (from the past 240 hours).   Radiology Studies: US  PELVIC COMPLETE WITH TRANSVAGINAL Result Date: 12/30/2023 CLINICAL DATA:  Initial evaluation for lesion of uterus. EXAM: TRANSABDOMINAL AND TRANSVAGINAL ULTRASOUND OF PELVIS TECHNIQUE: Both transabdominal and transvaginal ultrasound examinations of the pelvis were performed. Transabdominal technique was performed for global imaging of the pelvis including uterus, ovaries, adnexal regions, and pelvic cul-de-sac. It was necessary to proceed with endovaginal exam following the transabdominal exam to visualize the endometrium. COMPARISON:  CT from 12/29/2023 FINDINGS: Uterus Measurements: 8.7 x 4.0 x 5.8 cm = volume: 105.8 mL. Uterus is anteverted. 1.5 x 1.2 x 1.2 cm intramural fibroid present at the right uterine fundus. Endometrium Ovoid expansile masslike lesion measuring 2.2 x 1.4 x 2.0 cm seen within the endometrial cavity at the level of the mid uterine body, intimately associated with the endometrial complex (image 36). Associated vascularity seen with Doppler imaging. Finding is indeterminate. The underlying endometrial stripe is otherwise not well visualized. Small volume simple anechoic fluid noted within the partially distended endometrial cavity. Right ovary Measurements: 2.7 x 0.9 x 1.0 cm = volume: 1.2 mL. Normal appearance/no adnexal mass. Left ovary Measurements: 2.4 x 1.0 x 1.6 cm = volume: 1.8 mL. Normal appearance/no adnexal mass. Other findings No abnormal free fluid. IMPRESSION: 1. 2.2 x 1.4 x 2.0 cm expansile mass-like lesion within the endometrial cavity at the level of the mid uterine body. Finding is indeterminate, with primary differential considerations including an endometrial mass versus submucosal fibroid. Gynecologic  referral for further workup and evaluation recommended. Additionally, consider sonohysterogram for further evaluation, prior to hysteroscopy or endometrial biopsy. 2. 1.5 cm intramural fibroid at the right uterine fundus. 3. Normal sonographic appearance of the ovaries. No adnexal mass or free fluid. Electronically Signed   By: Virgia Griffins M.D.   On: 12/30/2023 03:42   CT ABDOMEN PELVIS W CONTRAST Result Date: 12/29/2023 CLINICAL DATA:  Acute abdominal pain. EXAM: CT ABDOMEN AND PELVIS WITH CONTRAST TECHNIQUE: Multidetector CT imaging of the abdomen and pelvis was performed using the standard protocol following bolus administration of intravenous contrast. RADIATION DOSE REDUCTION: This exam was performed according to the departmental dose-optimization program which includes automated exposure control, adjustment of the mA and/or kV according to patient size and/or use of iterative reconstruction technique. CONTRAST:  75mL OMNIPAQUE  IOHEXOL  350 MG/ML SOLN COMPARISON:  CT renal stone 03/30/2019. FINDINGS: Lower chest: No acute abnormality. Hepatobiliary: The liver is mildly enlarged. No focal liver lesions are seen. Gallbladder and bile ducts are within normal limits. Pancreas: Unremarkable. No pancreatic ductal dilatation or surrounding inflammatory changes. Spleen: Normal in size without focal abnormality. Adrenals/Urinary Tract: Right renal atrophy with multiple areas of scarring again noted.  There is some vague patchy areas of decreased attenuation in the inferior pole the right kidney. There are punctate calcifications in the right kidney. Left kidney appears within normal limits. There is no hydronephrosis. Adrenal glands and bladder are within normal limits. Stomach/Bowel: Stomach is within normal limits. Appendix appears normal. No evidence of bowel wall thickening, distention, or inflammatory changes. There is a large amount of stool in the ascending colon and transverse colon.  Vascular/Lymphatic: No significant vascular findings are present. No enlarged abdominal or pelvic lymph nodes. Reproductive: The uterus is heterogeneous. There is a central rounded mildly hyperdense area measuring up to 18 mm. Adnexa are within normal limits. Other: No abdominal wall hernia or abnormality. No abdominopelvic ascites. Musculoskeletal: There are degenerative changes at L4-L5. No acute osseous abnormality. IMPRESSION: 1. Vague patchy areas of decreased attenuation in the inferior pole of the right kidney worrisome for pyelonephritis. 2. Right renal atrophy with multiple areas of scarring. 3. Nonobstructing right renal calculi. 4. Mild hepatomegaly. 5. Large amount of stool in the ascending colon and transverse colon. 6. Heterogeneous uterus with a central rounded mildly hyperdense area measuring up to 18 mm. This may represent a fibroid. This can be further evaluated with pelvic ultrasound. Electronically Signed   By: Tyron Gallon M.D.   On: 12/29/2023 23:33   DG Chest Port 1 View Result Date: 12/29/2023 CLINICAL DATA:  Chest pain and intermittent shortness of breath for 1 day, pyelonephritis EXAM: PORTABLE CHEST 1 VIEW COMPARISON:  11/05/2010 FINDINGS: The heart size and mediastinal contours are within normal limits. Both lungs are clear. The visualized skeletal structures are unremarkable. IMPRESSION: No active disease. Electronically Signed   By: Bobbye Burrow M.D.   On: 12/29/2023 21:23    Scheduled Meds:  ARIPiprazole  5 mg Oral Daily   cyclobenzaprine  10 mg Oral QHS   enoxaparin (LOVENOX) injection  40 mg Subcutaneous Q24H   gabapentin  600 mg Oral TID   pantoprazole   40 mg Oral Daily   propranolol  20 mg Oral BID WC   sertraline  200 mg Oral Daily   sodium chloride  flush  3 mL Intravenous Q12H   zolpidem   5 mg Oral QHS   Continuous Infusions:  sodium chloride  100 mL/hr at 12/30/23 0351   meropenem (MERREM) IV Stopped (12/30/23 0431)     LOS: 0 days   Leona Rake,  MD [facility] P4/26/2025, 7:37 AM

## 2023-12-30 NOTE — ED Notes (Signed)
 Message sent to main pharmacy regarding missing dose of abx.

## 2023-12-30 NOTE — Plan of Care (Signed)

## 2023-12-31 DIAGNOSIS — F330 Major depressive disorder, recurrent, mild: Secondary | ICD-10-CM | POA: Diagnosis not present

## 2023-12-31 DIAGNOSIS — E876 Hypokalemia: Secondary | ICD-10-CM

## 2023-12-31 DIAGNOSIS — R519 Headache, unspecified: Secondary | ICD-10-CM | POA: Diagnosis not present

## 2023-12-31 DIAGNOSIS — G8929 Other chronic pain: Secondary | ICD-10-CM

## 2023-12-31 DIAGNOSIS — N12 Tubulo-interstitial nephritis, not specified as acute or chronic: Secondary | ICD-10-CM | POA: Diagnosis not present

## 2023-12-31 DIAGNOSIS — F419 Anxiety disorder, unspecified: Secondary | ICD-10-CM | POA: Diagnosis not present

## 2023-12-31 LAB — CBC
HCT: 36.3 % (ref 36.0–46.0)
Hemoglobin: 12 g/dL (ref 12.0–15.0)
MCH: 29.5 pg (ref 26.0–34.0)
MCHC: 33.1 g/dL (ref 30.0–36.0)
MCV: 89.2 fL (ref 80.0–100.0)
Platelets: 244 10*3/uL (ref 150–400)
RBC: 4.07 MIL/uL (ref 3.87–5.11)
RDW: 13 % (ref 11.5–15.5)
WBC: 5.9 10*3/uL (ref 4.0–10.5)
nRBC: 0 % (ref 0.0–0.2)

## 2023-12-31 LAB — BASIC METABOLIC PANEL WITH GFR
Anion gap: 9 (ref 5–15)
BUN: <5 mg/dL — ABNORMAL LOW (ref 6–20)
CO2: 27 mmol/L (ref 22–32)
Calcium: 8 mg/dL — ABNORMAL LOW (ref 8.9–10.3)
Chloride: 102 mmol/L (ref 98–111)
Creatinine, Ser: 0.74 mg/dL (ref 0.44–1.00)
GFR, Estimated: >60 mL/min (ref >60)
Glucose, Bld: 93 mg/dL (ref 70–99)
Potassium: 4.5 mmol/L (ref 3.5–5.1)
Sodium: 138 mmol/L (ref 135–145)

## 2023-12-31 LAB — URINE CULTURE: Culture: NO GROWTH

## 2023-12-31 MED ORDER — CIPROFLOXACIN HCL 500 MG PO TABS
500.0000 mg | ORAL_TABLET | Freq: Two times a day (BID) | ORAL | Status: AC
Start: 2023-12-31 — End: 2024-01-06

## 2023-12-31 NOTE — Discharge Summary (Signed)
 Physician Discharge Summary  Allison Nelson ZOX:[id number] DOB: 1976-07-21 DOA: 12/29/2023  PCP: Audley Bleak, MD  Admit date: 12/29/2023 Discharge date: 12/31/2023  Time spent: 60 minutes  Recommendations for Outpatient Follow-up:  Follow-up with Audley Bleak, MD in 2 weeks.  On follow-up patient will need a basic metabolic profile done to follow-up on electrolytes and renal function.  Patient may benefit from referral to GYN for further evaluation of suspected uterine fibroid.   Discharge Diagnoses:  Principal Problem:   Pyelonephritis Active Problems:   Chronic headache   Constipation   Mild episode of recurrent major depressive disorder (HCC)   Hyponatremia   Anxiety   Essential hypertension   Hypokalemia   Discharge Condition: Stable and improved.  Diet recommendation: Regular  Filed Weights   12/29/23 2044  Weight: 79.4 kg    History of present illness:  HPI per Dr. Jonathan Neighbor Allison Nelson is a 48 y.o. female with medical history significant for depression, anxiety, insomnia, IBS, chronic migraine, and hypertension who presents with right flank and abdominal pain, fever, and chills.   Patient developed pain in her right flank and abdomen that woke her from sleep the night of 12/27/2023.  She was seen at an urgent care the following day where she was diagnosed with pyelonephritis, given IM Rocephin , and sent home with ciprofloxacin.  She continues to have fevers and pain despite this.   ED Course: Upon arrival to the ED, patient is found to be febrile to 38.7 C and mildly tachycardic with stable BP.  Labs are most notable for sodium 129, potassium 3.4, creatinine 0.92, WBC 11,400, normal lactate x 2, and normal troponin x 2.  CT is concerning for right-sided pyelonephritis and heterogenous uterus with possible fibroid.   Urine culture was collected in the ED and the patient was treated with a 1 g IV Rocephin , acetaminophen , and Percocet.  Hospital Course:  #1  acute pyelonephritis secondary to E. coli -Patient noted to have presented with right flank pain which was worsening despite IM Rocephin  and 1 days worth of ciprofloxacin which was prescribed after being seen at the urgent care center.  Patient noted to have had fevers and noted on presentation to have a temp of 101.7. - Patient also noted to have a leukocytosis white count of 11.4. - CT abdomen and pelvis obtained concerning for right-sided pyelonephritis send heterogeneous uterus with possible fibroid noted. - Due to prior history of ESBL UTI from 07/04/2019 patient placed empirically on IV Merrem pending urine culture results. - Patient improved clinically, defervesced, leukocytosis resolved and patient was pain-free by day of discharge. -Urine cultures done during the hospitalization 12/29/2023 were negative. - Patient noted on her MyChart to have had urine culture results when seen at the urgent care center prior to antibiotics which grew out E. coli that was resistant to ampicillin however sensitive to the cephalosporins, fluoroquinolones, Zosyn. - As patient noted to have only had 1 days worth of ciprofloxacin patient will be discharged on 6 more days of ciprofloxacin, patient still has pills from prescription written by urgent care center and will complete that course. - Patient noted to have received 5 doses of IV Merrem during the hospitalization. - Patient will be discharged in stable and improved condition with outpatient follow-up with PCP.  2.  Suspected uterine fibroid -Noted on CT abdomen and pelvis. - Pelvic ultrasound showed an endometrial mass versus submucosal fibroid. -Patient denies any significant menorrhagia, patient did state that after being placed on oral contraceptives  her menses cycle has become more regular. - Outpatient follow-up with OB/GYN. - Discussed with OB/GYN in-house.  3.  Depression/anxiety/insomnia -Patient maintained on home regimen Abilify, Zoloft,  Ambien , as needed Atarax.  4.  Chronic migraine -Patient maintained on home regimen propranolol and Imitrex.  Sumatriptan nasal spray added. - Outpatient follow-up.  5.  Hyponatremia -Likely secondary to hypovolemic hyponatremia, resolved with hydration.  6.  Hypokalemia -Repleted during the hospitalization.  Procedures: CT abdomen and pelvis 12/29/2023 Chest x-ray 12/29/2023 Pelvic ultrasound 12/30/2023  Consultations: None  Discharge Exam: Vitals:   12/31/23 0457 12/31/23 0840  BP: 101/65 116/77  Pulse: 78 81  Resp: 16 19  Temp: 98.2 F (36.8 C) 98.1 F (36.7 C)  SpO2: 97% 98%    General: NAD Cardiovascular: RRR no murmurs rubs or gallops.  No JVD.  No lower extremity edema. Respiratory: Clear to auscultation bilaterally.  No wheezes, no crackles, no rhonchi.  Fair air movement.  Speaking in full sentences.  Discharge Instructions   Discharge Instructions     Diet general   Complete by: As directed    Increase activity slowly   Complete by: As directed       Allergies as of 12/31/2023   No Known Allergies      Medication List     TAKE these medications    Aimovig 140 MG/ML Soaj Generic drug: Erenumab-aooe Inject 140 mg into the skin every 30 (thirty) days.   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   amphetamine-dextroamphetamine 30 MG 24 hr capsule Commonly known as: ADDERALL XR Take 30 mg by mouth 2 (two) times daily with breakfast and lunch.   ARIPiprazole 5 MG tablet Commonly known as: ABILIFY Take 5 mg by mouth daily.   atomoxetine 80 MG capsule Commonly known as: STRATTERA Take 80 mg by mouth at bedtime.   celecoxib 100 MG capsule Commonly known as: CELEBREX Take 100 mg by mouth 2 (two) times daily.   cetirizine 10 MG tablet Commonly known as: ZYRTEC Take 10 mg by mouth daily.   ciprofloxacin 500 MG tablet Commonly known as: CIPRO Take 1 tablet (500 mg  total) by mouth 2 (two) times daily for 6 days.   cyclobenzaprine 10 MG tablet Commonly known as: FLEXERIL Take 10 mg by mouth at bedtime.   Enbrel SureClick 50 MG/ML injection Generic drug: etanercept Inject 50 mg into the skin once a week. Thursday   gabapentin 300 MG capsule Commonly known as: NEURONTIN Take 600 mg by mouth 3 (three) times daily.   HYDROcodone-acetaminophen  10-325 MG tablet Commonly known as: NORCO Take 1 tablet by mouth 4 (four) times daily as needed for moderate pain (pain score 4-6).   hydrOXYzine 25 MG tablet Commonly known as: ATARAX Take 25 mg by mouth every 6 (six) hours as needed for anxiety.   hyoscyamine 0.125 MG tablet Commonly known as: LEVSIN Take 0.125 mg by mouth every 6 (six) hours as needed for cramping.   Junel FE 1.5/30 1.5-30 MG-MCG tablet Generic drug: norethindrone-ethinyl estradiol -iron Take 1 tablet by mouth daily.   magnesium oxide 400 (240 Mg) MG tablet Commonly known as: MAG-OX Take 400 mg by mouth daily.   Mounjaro 12.5 MG/0.5ML Pen Generic drug: tirzepatide Inject 12.5 mg into the skin once a week. Thursday   Multi-Vitamins Tabs Take 1 tablet by mouth daily.   omeprazole 20 MG capsule Commonly known as: PRILOSEC Take 20 mg by mouth daily.  promethazine 25 MG tablet Commonly known as: PHENERGAN Take 25 mg by mouth daily as needed for nausea.   propranolol 20 MG tablet Commonly known as: INDERAL Take 20 mg by mouth 2 (two) times daily with breakfast and lunch.   Qulipta 60 MG Tabs Generic drug: Atogepant Take 60 mg by mouth daily.   sertraline 100 MG tablet Commonly known as: ZOLOFT Take 200 mg by mouth daily.   SUMAtriptan 6 MG/0.5ML Soaj Inject 0.5 mLs as directed daily as needed (migraine).   SUMAtriptan 100 MG tablet Commonly known as: IMITREX Take 100 mg by mouth every 8 (eight) hours as needed for migraine or headache.   Ubrelvy 100 MG Tabs Generic drug: Ubrogepant Take 100 mg by mouth See  admin instructions. Take 1 tablet by mouth for the onset of migraine, second dose may be taken 2 hours after initial dose.   zolpidem  10 MG tablet Commonly known as: AMBIEN  Take 5 mg by mouth at bedtime.       No Known Allergies  Follow-up Information     Audley Bleak, MD. Schedule an appointment as soon as possible for a visit in 2 week(s).   Specialty: Internal Medicine                 The results of significant diagnostics from this hospitalization (including imaging, microbiology, ancillary and laboratory) are listed below for reference.    Significant Diagnostic Studies: US  PELVIC COMPLETE WITH TRANSVAGINAL Result Date: 12/30/2023 CLINICAL DATA:  Initial evaluation for lesion of uterus. EXAM: TRANSABDOMINAL AND TRANSVAGINAL ULTRASOUND OF PELVIS TECHNIQUE: Both transabdominal and transvaginal ultrasound examinations of the pelvis were performed. Transabdominal technique was performed for global imaging of the pelvis including uterus, ovaries, adnexal regions, and pelvic cul-de-sac. It was necessary to proceed with endovaginal exam following the transabdominal exam to visualize the endometrium. COMPARISON:  CT from 12/29/2023 FINDINGS: Uterus Measurements: 8.7 x 4.0 x 5.8 cm = volume: 105.8 mL. Uterus is anteverted. 1.5 x 1.2 x 1.2 cm intramural fibroid present at the right uterine fundus. Endometrium Ovoid expansile masslike lesion measuring 2.2 x 1.4 x 2.0 cm seen within the endometrial cavity at the level of the mid uterine body, intimately associated with the endometrial complex (image 36). Associated vascularity seen with Doppler imaging. Finding is indeterminate. The underlying endometrial stripe is otherwise not well visualized. Small volume simple anechoic fluid noted within the partially distended endometrial cavity. Right ovary Measurements: 2.7 x 0.9 x 1.0 cm = volume: 1.2 mL. Normal appearance/no adnexal mass. Left ovary Measurements: 2.4 x 1.0 x 1.6 cm = volume: 1.8 mL.  Normal appearance/no adnexal mass. Other findings No abnormal free fluid. IMPRESSION: 1. 2.2 x 1.4 x 2.0 cm expansile mass-like lesion within the endometrial cavity at the level of the mid uterine body. Finding is indeterminate, with primary differential considerations including an endometrial mass versus submucosal fibroid. Gynecologic referral for further workup and evaluation recommended. Additionally, consider sonohysterogram for further evaluation, prior to hysteroscopy or endometrial biopsy. 2. 1.5 cm intramural fibroid at the right uterine fundus. 3. Normal sonographic appearance of the ovaries. No adnexal mass or free fluid. Electronically Signed   By: Virgia Griffins M.D.   On: 12/30/2023 03:42   CT ABDOMEN PELVIS W CONTRAST Result Date: 12/29/2023 CLINICAL DATA:  Acute abdominal pain. EXAM: CT ABDOMEN AND PELVIS WITH CONTRAST TECHNIQUE: Multidetector CT imaging of the abdomen and pelvis was performed using the standard protocol following bolus administration of intravenous contrast. RADIATION DOSE REDUCTION: This exam was performed according  to the departmental dose-optimization program which includes automated exposure control, adjustment of the mA and/or kV according to patient size and/or use of iterative reconstruction technique. CONTRAST:  75mL OMNIPAQUE  IOHEXOL  350 MG/ML SOLN COMPARISON:  CT renal stone 03/30/2019. FINDINGS: Lower chest: No acute abnormality. Hepatobiliary: The liver is mildly enlarged. No focal liver lesions are seen. Gallbladder and bile ducts are within normal limits. Pancreas: Unremarkable. No pancreatic ductal dilatation or surrounding inflammatory changes. Spleen: Normal in size without focal abnormality. Adrenals/Urinary Tract: Right renal atrophy with multiple areas of scarring again noted. There is some vague patchy areas of decreased attenuation in the inferior pole the right kidney. There are punctate calcifications in the right kidney. Left kidney appears within  normal limits. There is no hydronephrosis. Adrenal glands and bladder are within normal limits. Stomach/Bowel: Stomach is within normal limits. Appendix appears normal. No evidence of bowel wall thickening, distention, or inflammatory changes. There is a large amount of stool in the ascending colon and transverse colon. Vascular/Lymphatic: No significant vascular findings are present. No enlarged abdominal or pelvic lymph nodes. Reproductive: The uterus is heterogeneous. There is a central rounded mildly hyperdense area measuring up to 18 mm. Adnexa are within normal limits. Other: No abdominal wall hernia or abnormality. No abdominopelvic ascites. Musculoskeletal: There are degenerative changes at L4-L5. No acute osseous abnormality. IMPRESSION: 1. Vague patchy areas of decreased attenuation in the inferior pole of the right kidney worrisome for pyelonephritis. 2. Right renal atrophy with multiple areas of scarring. 3. Nonobstructing right renal calculi. 4. Mild hepatomegaly. 5. Large amount of stool in the ascending colon and transverse colon. 6. Heterogeneous uterus with a central rounded mildly hyperdense area measuring up to 18 mm. This may represent a fibroid. This can be further evaluated with pelvic ultrasound. Electronically Signed   By: Tyron Gallon M.D.   On: 12/29/2023 23:33   DG Chest Port 1 View Result Date: 12/29/2023 CLINICAL DATA:  Chest pain and intermittent shortness of breath for 1 day, pyelonephritis EXAM: PORTABLE CHEST 1 VIEW COMPARISON:  11/05/2010 FINDINGS: The heart size and mediastinal contours are within normal limits. Both lungs are clear. The visualized skeletal structures are unremarkable. IMPRESSION: No active disease. Electronically Signed   By: Bobbye Burrow M.D.   On: 12/29/2023 21:23    Microbiology: Recent Results (from the past 240 hours)  Urine Culture     Status: None   Collection Time: 12/29/23  9:00 PM   Specimen: Urine, Random  Result Value Ref Range Status    Specimen Description URINE, RANDOM  Final   Special Requests NONE Reflexed from [id number]  Final   Culture   Final    NO GROWTH Performed at [facility], [address], [place], [place] [address]    Report Status 12/31/2023 FINAL  Final  Culture, blood (x 2)     Status: None (Preliminary result)   Collection Time: 12/30/23 12:28 AM   Specimen: BLOOD  Result Value Ref Range Status   Specimen Description BLOOD RIGHT ANTECUBITAL  Final   Special Requests   Final    BOTTLES DRAWN AEROBIC AND ANAEROBIC Blood Culture adequate volume   Culture   Final    NO GROWTH 1 DAY Performed at [facility], [address], [place], [place] [address]    Report Status PENDING  Incomplete  Culture, blood (x 2)     Status: None (Preliminary result)   Collection Time: 12/30/23 12:30 AM   Specimen: BLOOD LEFT HAND  Result Value Ref Range Status   Specimen Description BLOOD LEFT HAND  Final   Special Requests   Final    BOTTLES DRAWN AEROBIC ONLY Blood Culture results may not be optimal due to an inadequate volume of blood received in culture bottles   Culture   Final    NO GROWTH 1 DAY Performed at [facility], [address], [place], [place] [address]    Report Status PENDING  Incomplete     Labs: Basic Metabolic Panel: Recent Labs  Lab 12/29/23 2057 12/30/23 0828 12/31/23 0619  NA 129* 136 138  K 3.4* 3.4* 4.5  CL 96* 104 102  CO2 22 22 27   GLUCOSE 97 95 93  BUN 10 6 <5*  CREATININE 0.92 0.83 0.74  CALCIUM 8.4* 8.0* 8.0*  MG  --  2.0  --    Liver Function Tests: Recent Labs  Lab 12/29/23 2057  AST 26  ALT 25  ALKPHOS 47  BILITOT 0.5  PROT 6.9  ALBUMIN 3.2*   Recent Labs  Lab 12/29/23 2057  LIPASE 31   No results for input(s): "AMMONIA" in the last 168 hours. CBC: Recent Labs  Lab 12/29/23 2057 12/30/23 0828 12/31/23 0619  WBC 11.4* 9.5 5.9  NEUTROABS 8.9*  --   --   HGB 12.4 12.0 12.0  HCT 36.8 36.0 36.3  MCV 89.8 88.5 89.2  PLT 231 224 244    Cardiac Enzymes: No results for input(s): "CKTOTAL", "CKMB", "CKMBINDEX", "TROPONINI" in the last 168 hours. BNP: BNP (last 3 results) No results for input(s): "BNP" in the last 8760 hours.  ProBNP (last 3 results) No results for input(s): "PROBNP" in the last 8760 hours.  CBG: No results for input(s): "GLUCAP" in the last 168 hours.     Signed:  Hilda Lovings MD.  [facility] 12/31/2023, 3:35 PM

## 2023-12-31 NOTE — Plan of Care (Signed)

## 2024-01-04 LAB — CULTURE, BLOOD (ROUTINE X 2)
Culture: NO GROWTH
Culture: NO GROWTH
Special Requests: ADEQUATE

## 2024-03-15 ENCOUNTER — Other Ambulatory Visit: Payer: Self-pay | Admitting: Obstetrics and Gynecology

## 2024-03-18 LAB — SURGICAL PATHOLOGY

## 2024-10-11 ENCOUNTER — Ambulatory Visit: Payer: Self-pay
